# Patient Record
Sex: Male | Born: 1935 | Race: White | Hispanic: No | Marital: Married | Smoking: Former smoker
Health system: Southern US, Community
[De-identification: ages and names within clinical notes are randomized; demographics above are authoritative.]

## PROBLEM LIST (undated history)

## (undated) DIAGNOSIS — I10 Essential (primary) hypertension: Secondary | ICD-10-CM

## (undated) DIAGNOSIS — H9190 Unspecified hearing loss, unspecified ear: Secondary | ICD-10-CM

## (undated) DIAGNOSIS — C189 Malignant neoplasm of colon, unspecified: Secondary | ICD-10-CM

## (undated) DIAGNOSIS — T4145XA Adverse effect of unspecified anesthetic, initial encounter: Secondary | ICD-10-CM

## (undated) DIAGNOSIS — C7931 Secondary malignant neoplasm of brain: Secondary | ICD-10-CM

## (undated) DIAGNOSIS — G709 Myoneural disorder, unspecified: Secondary | ICD-10-CM

## (undated) DIAGNOSIS — F32A Depression, unspecified: Secondary | ICD-10-CM

## (undated) DIAGNOSIS — R972 Elevated prostate specific antigen [PSA]: Secondary | ICD-10-CM

## (undated) DIAGNOSIS — R159 Full incontinence of feces: Secondary | ICD-10-CM

## (undated) DIAGNOSIS — Z9889 Other specified postprocedural states: Secondary | ICD-10-CM

## (undated) DIAGNOSIS — Z9289 Personal history of other medical treatment: Secondary | ICD-10-CM

## (undated) DIAGNOSIS — F03A0 Unspecified dementia, mild, without behavioral disturbance, psychotic disturbance, mood disturbance, and anxiety: Secondary | ICD-10-CM

## (undated) DIAGNOSIS — R203 Hyperesthesia: Secondary | ICD-10-CM

## (undated) DIAGNOSIS — G930 Cerebral cysts: Secondary | ICD-10-CM

## (undated) DIAGNOSIS — F0390 Unspecified dementia without behavioral disturbance: Secondary | ICD-10-CM

## (undated) DIAGNOSIS — E7800 Pure hypercholesterolemia, unspecified: Secondary | ICD-10-CM

## (undated) DIAGNOSIS — N200 Calculus of kidney: Secondary | ICD-10-CM

## (undated) DIAGNOSIS — Z85828 Personal history of other malignant neoplasm of skin: Secondary | ICD-10-CM

## (undated) DIAGNOSIS — F329 Major depressive disorder, single episode, unspecified: Secondary | ICD-10-CM

## (undated) DIAGNOSIS — Z923 Personal history of irradiation: Secondary | ICD-10-CM

## (undated) DIAGNOSIS — M1990 Unspecified osteoarthritis, unspecified site: Secondary | ICD-10-CM

## (undated) DIAGNOSIS — C439 Malignant melanoma of skin, unspecified: Secondary | ICD-10-CM

## (undated) DIAGNOSIS — T8859XA Other complications of anesthesia, initial encounter: Secondary | ICD-10-CM

## (undated) HISTORY — PX: HEMORRHOID SURGERY: SHX153

## (undated) HISTORY — PX: CHOLECYSTECTOMY: SHX55

## (undated) HISTORY — DX: Pure hypercholesterolemia, unspecified: E78.00

## (undated) HISTORY — PX: APPENDECTOMY: SHX54

## (undated) HISTORY — DX: Malignant neoplasm of colon, unspecified: C18.9

## (undated) HISTORY — DX: Malignant melanoma of skin, unspecified: C43.9

## (undated) HISTORY — PX: COLON SURGERY: SHX602

## (undated) HISTORY — DX: Personal history of other malignant neoplasm of skin: Z85.828

## (undated) HISTORY — DX: Other specified postprocedural states: Z98.890

## (undated) HISTORY — DX: Cerebral cysts: G93.0

## (undated) HISTORY — PX: OTHER SURGICAL HISTORY: SHX169

## (undated) HISTORY — DX: Essential (primary) hypertension: I10

## (undated) HISTORY — DX: Secondary malignant neoplasm of brain: C79.31

## (undated) HISTORY — PX: SKIN GRAFT: SHX250

## (undated) HISTORY — PX: VASECTOMY: SHX75

## (undated) HISTORY — DX: Personal history of irradiation: Z92.3

## (undated) HISTORY — PX: MELANOMA EXCISION: SHX5266

---

## 1999-08-06 ENCOUNTER — Ambulatory Visit (HOSPITAL_BASED_OUTPATIENT_CLINIC_OR_DEPARTMENT_OTHER): Admission: RE | Admit: 1999-08-06 | Discharge: 1999-08-06 | Payer: Self-pay | Admitting: *Deleted

## 2002-01-30 ENCOUNTER — Ambulatory Visit (HOSPITAL_COMMUNITY): Admission: RE | Admit: 2002-01-30 | Discharge: 2002-01-30 | Payer: Self-pay | Admitting: Internal Medicine

## 2003-04-21 HISTORY — PX: OTHER SURGICAL HISTORY: SHX169

## 2003-04-21 HISTORY — PX: EXTERNAL EAR SURGERY: SHX627

## 2004-07-31 ENCOUNTER — Other Ambulatory Visit: Admission: RE | Admit: 2004-07-31 | Discharge: 2004-07-31 | Payer: Self-pay | Admitting: Unknown Physician Specialty

## 2004-10-02 ENCOUNTER — Other Ambulatory Visit: Admission: RE | Admit: 2004-10-02 | Discharge: 2004-10-02 | Payer: Self-pay | Admitting: Unknown Physician Specialty

## 2004-10-30 ENCOUNTER — Other Ambulatory Visit: Admission: RE | Admit: 2004-10-30 | Discharge: 2004-10-30 | Payer: Self-pay | Admitting: Dermatology

## 2005-01-19 ENCOUNTER — Ambulatory Visit (HOSPITAL_COMMUNITY): Admission: RE | Admit: 2005-01-19 | Discharge: 2005-01-19 | Payer: Self-pay | Admitting: Orthopaedic Surgery

## 2006-04-20 DIAGNOSIS — C189 Malignant neoplasm of colon, unspecified: Secondary | ICD-10-CM

## 2006-04-20 HISTORY — DX: Malignant neoplasm of colon, unspecified: C18.9

## 2006-04-21 ENCOUNTER — Ambulatory Visit (HOSPITAL_COMMUNITY): Admission: RE | Admit: 2006-04-21 | Discharge: 2006-04-21 | Payer: Self-pay

## 2006-07-08 ENCOUNTER — Ambulatory Visit (HOSPITAL_COMMUNITY): Admission: RE | Admit: 2006-07-08 | Discharge: 2006-07-08 | Payer: Self-pay | Admitting: Pulmonary Disease

## 2006-07-16 ENCOUNTER — Ambulatory Visit (HOSPITAL_COMMUNITY): Admission: RE | Admit: 2006-07-16 | Discharge: 2006-07-16 | Payer: Self-pay | Admitting: Family Medicine

## 2006-08-18 ENCOUNTER — Ambulatory Visit: Payer: Self-pay | Admitting: Internal Medicine

## 2006-09-03 ENCOUNTER — Ambulatory Visit (HOSPITAL_COMMUNITY): Admission: RE | Admit: 2006-09-03 | Discharge: 2006-09-03 | Payer: Self-pay | Admitting: Internal Medicine

## 2006-09-03 ENCOUNTER — Ambulatory Visit: Payer: Self-pay | Admitting: Internal Medicine

## 2006-09-06 ENCOUNTER — Ambulatory Visit (HOSPITAL_COMMUNITY): Admission: RE | Admit: 2006-09-06 | Discharge: 2006-09-06 | Payer: Self-pay | Admitting: Internal Medicine

## 2006-09-29 ENCOUNTER — Encounter: Payer: Self-pay | Admitting: Internal Medicine

## 2006-09-29 ENCOUNTER — Ambulatory Visit (HOSPITAL_COMMUNITY): Admission: RE | Admit: 2006-09-29 | Discharge: 2006-09-29 | Payer: Self-pay | Admitting: Internal Medicine

## 2006-09-29 ENCOUNTER — Ambulatory Visit: Payer: Self-pay | Admitting: Internal Medicine

## 2006-11-04 ENCOUNTER — Inpatient Hospital Stay (HOSPITAL_COMMUNITY): Admission: RE | Admit: 2006-11-04 | Discharge: 2006-11-09 | Payer: Self-pay | Admitting: Surgery

## 2006-11-04 ENCOUNTER — Encounter (INDEPENDENT_AMBULATORY_CARE_PROVIDER_SITE_OTHER): Payer: Self-pay | Admitting: Surgery

## 2006-11-17 ENCOUNTER — Ambulatory Visit (HOSPITAL_COMMUNITY): Admission: RE | Admit: 2006-11-17 | Discharge: 2006-11-17 | Payer: Self-pay | Admitting: Neurosurgery

## 2006-12-10 ENCOUNTER — Ambulatory Visit (HOSPITAL_COMMUNITY): Payer: Self-pay | Admitting: Oncology

## 2006-12-10 ENCOUNTER — Encounter (HOSPITAL_COMMUNITY): Admission: RE | Admit: 2006-12-10 | Discharge: 2007-01-09 | Payer: Self-pay | Admitting: Oncology

## 2007-03-14 ENCOUNTER — Encounter (HOSPITAL_COMMUNITY): Admission: RE | Admit: 2007-03-14 | Discharge: 2007-04-13 | Payer: Self-pay | Admitting: Oncology

## 2007-03-14 ENCOUNTER — Ambulatory Visit (HOSPITAL_COMMUNITY): Payer: Self-pay | Admitting: Oncology

## 2007-06-06 ENCOUNTER — Encounter (HOSPITAL_COMMUNITY): Admission: RE | Admit: 2007-06-06 | Discharge: 2007-07-06 | Payer: Self-pay | Admitting: Oncology

## 2007-06-06 ENCOUNTER — Ambulatory Visit (HOSPITAL_COMMUNITY): Payer: Self-pay | Admitting: Oncology

## 2007-09-05 ENCOUNTER — Ambulatory Visit (HOSPITAL_COMMUNITY): Payer: Self-pay | Admitting: Oncology

## 2007-09-05 ENCOUNTER — Encounter (HOSPITAL_COMMUNITY): Admission: RE | Admit: 2007-09-05 | Discharge: 2007-10-05 | Payer: Self-pay | Admitting: Oncology

## 2007-10-10 ENCOUNTER — Ambulatory Visit (HOSPITAL_COMMUNITY): Admission: RE | Admit: 2007-10-10 | Discharge: 2007-10-10 | Payer: Self-pay | Admitting: Family Medicine

## 2007-10-11 ENCOUNTER — Encounter: Payer: Self-pay | Admitting: Internal Medicine

## 2007-10-11 ENCOUNTER — Ambulatory Visit: Payer: Self-pay | Admitting: Internal Medicine

## 2007-10-11 ENCOUNTER — Ambulatory Visit (HOSPITAL_COMMUNITY): Admission: RE | Admit: 2007-10-11 | Discharge: 2007-10-11 | Payer: Self-pay | Admitting: Internal Medicine

## 2007-12-05 ENCOUNTER — Encounter (HOSPITAL_COMMUNITY): Admission: RE | Admit: 2007-12-05 | Discharge: 2008-01-04 | Payer: Self-pay | Admitting: Oncology

## 2007-12-05 ENCOUNTER — Ambulatory Visit (HOSPITAL_COMMUNITY): Payer: Self-pay | Admitting: Oncology

## 2008-03-05 ENCOUNTER — Ambulatory Visit (HOSPITAL_COMMUNITY): Payer: Self-pay | Admitting: Oncology

## 2008-03-05 ENCOUNTER — Encounter (HOSPITAL_COMMUNITY): Admission: RE | Admit: 2008-03-05 | Discharge: 2008-04-04 | Payer: Self-pay | Admitting: Oncology

## 2008-04-17 ENCOUNTER — Encounter (HOSPITAL_COMMUNITY): Admission: RE | Admit: 2008-04-17 | Discharge: 2008-05-17 | Payer: Self-pay | Admitting: Oncology

## 2008-07-18 ENCOUNTER — Encounter (HOSPITAL_COMMUNITY): Admission: RE | Admit: 2008-07-18 | Discharge: 2008-08-17 | Payer: Self-pay | Admitting: Oncology

## 2008-07-18 ENCOUNTER — Ambulatory Visit (HOSPITAL_COMMUNITY): Payer: Self-pay | Admitting: Oncology

## 2008-12-05 ENCOUNTER — Ambulatory Visit (HOSPITAL_COMMUNITY): Payer: Self-pay | Admitting: Oncology

## 2008-12-05 ENCOUNTER — Encounter (HOSPITAL_COMMUNITY): Admission: RE | Admit: 2008-12-05 | Discharge: 2009-01-04 | Payer: Self-pay | Admitting: Oncology

## 2009-01-02 ENCOUNTER — Encounter: Payer: Self-pay | Admitting: Internal Medicine

## 2009-03-06 ENCOUNTER — Encounter (HOSPITAL_COMMUNITY): Admission: RE | Admit: 2009-03-06 | Discharge: 2009-04-05 | Payer: Self-pay | Admitting: Oncology

## 2009-03-06 ENCOUNTER — Ambulatory Visit (HOSPITAL_COMMUNITY): Payer: Self-pay | Admitting: Oncology

## 2009-05-03 ENCOUNTER — Ambulatory Visit (HOSPITAL_COMMUNITY): Admission: RE | Admit: 2009-05-03 | Discharge: 2009-05-03 | Payer: Self-pay | Admitting: Neurosurgery

## 2009-05-29 ENCOUNTER — Encounter (HOSPITAL_COMMUNITY): Admission: RE | Admit: 2009-05-29 | Discharge: 2009-06-28 | Payer: Self-pay | Admitting: Oncology

## 2009-05-29 ENCOUNTER — Ambulatory Visit (HOSPITAL_COMMUNITY): Payer: Self-pay | Admitting: Oncology

## 2009-08-21 ENCOUNTER — Ambulatory Visit (HOSPITAL_COMMUNITY): Payer: Self-pay | Admitting: Oncology

## 2009-08-21 ENCOUNTER — Encounter (HOSPITAL_COMMUNITY): Admission: RE | Admit: 2009-08-21 | Discharge: 2009-09-20 | Payer: Self-pay | Admitting: Oncology

## 2009-12-04 ENCOUNTER — Ambulatory Visit (HOSPITAL_COMMUNITY): Payer: Self-pay | Admitting: Oncology

## 2009-12-04 ENCOUNTER — Encounter (HOSPITAL_COMMUNITY): Admission: RE | Admit: 2009-12-04 | Discharge: 2010-01-03 | Payer: Self-pay | Admitting: Oncology

## 2009-12-20 ENCOUNTER — Encounter: Payer: Self-pay | Admitting: Internal Medicine

## 2010-02-26 ENCOUNTER — Ambulatory Visit (HOSPITAL_COMMUNITY): Payer: Self-pay | Admitting: Oncology

## 2010-02-26 ENCOUNTER — Encounter (HOSPITAL_COMMUNITY)
Admission: RE | Admit: 2010-02-26 | Discharge: 2010-03-28 | Payer: Self-pay | Source: Home / Self Care | Attending: Oncology | Admitting: Oncology

## 2010-05-14 ENCOUNTER — Encounter (HOSPITAL_COMMUNITY)
Admission: RE | Admit: 2010-05-14 | Discharge: 2010-05-20 | Payer: Self-pay | Source: Home / Self Care | Attending: Oncology | Admitting: Oncology

## 2010-05-14 ENCOUNTER — Ambulatory Visit (HOSPITAL_COMMUNITY)
Admission: RE | Admit: 2010-05-14 | Discharge: 2010-05-20 | Payer: Self-pay | Source: Home / Self Care | Attending: Oncology | Admitting: Oncology

## 2010-05-14 LAB — CEA: CEA: 1.4 ng/mL (ref 0.0–5.0)

## 2010-05-22 NOTE — Letter (Signed)
Summary: [facility]  [facility]   Imported By: Rexene Alberts 12/20/2009 11:47:41  _____________________________________________________________________  External Attachment:    Type:   Image     Comment:   External Document

## 2010-07-01 LAB — CEA: CEA: 1.2 ng/mL (ref 0.0–5.0)

## 2010-07-04 LAB — COMPREHENSIVE METABOLIC PANEL
ALT: 19 U/L (ref 0–53)
AST: 20 U/L (ref 0–37)
Albumin: 3.7 g/dL (ref 3.5–5.2)
Alkaline Phosphatase: 64 U/L (ref 39–117)
BUN: 7 mg/dL (ref 6–23)
CO2: 28 mEq/L (ref 19–32)
Calcium: 9.3 mg/dL (ref 8.4–10.5)
Chloride: 109 mEq/L (ref 96–112)
Creatinine, Ser: 0.8 mg/dL (ref 0.4–1.5)
GFR calc Af Amer: >60 mL/min (ref >60)
GFR calc non Af Amer: >60 mL/min (ref >60)
Glucose, Bld: 83 mg/dL (ref 70–99)
Potassium: 4.2 mEq/L (ref 3.5–5.1)
Sodium: 141 mEq/L (ref 135–145)
Total Bilirubin: 0.7 mg/dL (ref 0.3–1.2)
Total Protein: 6.4 g/dL (ref 6.0–8.3)

## 2010-07-04 LAB — DIFFERENTIAL
Basophils Absolute: 0 10*3/uL (ref 0.0–0.1)
Basophils Relative: 1 % (ref 0–1)
Eosinophils Absolute: 0 10*3/uL (ref 0.0–0.7)
Eosinophils Relative: 1 % (ref 0–5)
Lymphocytes Relative: 30 % (ref 12–46)
Lymphs Abs: 1.9 10*3/uL (ref 0.7–4.0)
Monocytes Absolute: 0.7 10*3/uL (ref 0.1–1.0)
Monocytes Relative: 11 % (ref 3–12)
Neutro Abs: 3.8 10*3/uL (ref 1.7–7.7)
Neutrophils Relative %: 58 % (ref 43–77)

## 2010-07-04 LAB — CBC
HCT: 45.1 % (ref 39.0–52.0)
Hemoglobin: 15.1 g/dL (ref 13.0–17.0)
MCH: 30.7 pg (ref 26.0–34.0)
MCHC: 33.4 g/dL (ref 30.0–36.0)
MCV: 91.9 fL (ref 78.0–100.0)
Platelets: 169 10*3/uL (ref 150–400)
RBC: 4.91 MIL/uL (ref 4.22–5.81)
RDW: 13.8 % (ref 11.5–15.5)
WBC: 6.5 10*3/uL (ref 4.0–10.5)

## 2010-07-04 LAB — CEA: CEA: 1.8 ng/mL (ref 0.0–5.0)

## 2010-07-06 LAB — CREATININE, SERUM
Creatinine, Ser: 0.86 mg/dL (ref 0.4–1.5)
GFR calc Af Amer: >60 mL/min (ref >60)
GFR calc non Af Amer: >60 mL/min (ref >60)

## 2010-07-08 LAB — CEA: CEA: 1.3 ng/mL (ref 0.0–5.0)

## 2010-07-09 LAB — CEA: CEA: 1.8 ng/mL (ref 0.0–5.0)

## 2010-07-23 LAB — CEA: CEA: 0.9 ng/mL (ref 0.0–5.0)

## 2010-07-26 LAB — DIFFERENTIAL
Eosinophils Relative: 1 % (ref 0–5)
Lymphocytes Relative: 30 % (ref 12–46)
Lymphs Abs: 1.6 10*3/uL (ref 0.7–4.0)
Monocytes Absolute: 0.5 10*3/uL (ref 0.1–1.0)
Monocytes Relative: 10 % (ref 3–12)
Neutro Abs: 3 10*3/uL (ref 1.7–7.7)

## 2010-07-26 LAB — COMPREHENSIVE METABOLIC PANEL
ALT: 19 U/L (ref 0–53)
AST: 22 U/L (ref 0–37)
Albumin: 3.7 g/dL (ref 3.5–5.2)
Alkaline Phosphatase: 72 U/L (ref 39–117)
BUN: 12 mg/dL (ref 6–23)
CO2: 28 mEq/L (ref 19–32)
Calcium: 9 mg/dL (ref 8.4–10.5)
Chloride: 106 mEq/L (ref 96–112)
Creatinine, Ser: 0.84 mg/dL (ref 0.4–1.5)
GFR calc Af Amer: >60 mL/min (ref >60)
GFR calc non Af Amer: >60 mL/min (ref >60)
Glucose, Bld: 95 mg/dL (ref 70–99)
Potassium: 3.6 mEq/L (ref 3.5–5.1)
Sodium: 140 mEq/L (ref 135–145)
Total Bilirubin: 0.5 mg/dL (ref 0.3–1.2)
Total Protein: 6.5 g/dL (ref 6.0–8.3)

## 2010-07-26 LAB — CBC
HCT: 43.2 % (ref 39.0–52.0)
Hemoglobin: 14.9 g/dL (ref 13.0–17.0)
MCHC: 34.6 g/dL (ref 30.0–36.0)
MCV: 91.3 fL (ref 78.0–100.0)
Platelets: 166 10*3/uL (ref 150–400)
RBC: 4.73 MIL/uL (ref 4.22–5.81)
RDW: 13.8 % (ref 11.5–15.5)
WBC: 5.2 10*3/uL (ref 4.0–10.5)

## 2010-07-26 LAB — CEA: CEA: 1.4 ng/mL (ref 0.0–5.0)

## 2010-08-06 ENCOUNTER — Encounter (HOSPITAL_COMMUNITY): Payer: Medicare Other | Attending: Oncology

## 2010-08-06 DIAGNOSIS — Z85038 Personal history of other malignant neoplasm of large intestine: Secondary | ICD-10-CM | POA: Insufficient documentation

## 2010-08-06 DIAGNOSIS — C189 Malignant neoplasm of colon, unspecified: Secondary | ICD-10-CM

## 2010-09-02 NOTE — Op Note (Signed)
NAMEMarland Kitchen  Andre Pollard, Andre Pollard              ACCOUNT NO.:  [id number]   MEDICAL RECORD NO.:  [id number]          PATIENT TYPE:  INP   LOCATION:  1301                         FACILITY:  [facility]   PHYSICIAN:  Thornton Park. Daphine Deutscher, MD  DATE OF BIRTH:  May 20, 1929   DATE OF PROCEDURE:  11/04/2006  DATE OF DISCHARGE:                               OPERATIVE REPORT   CCS number [id number].   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   PROCEDURE:  Transverse colectomy with primary side-to-side hand-sewn  anastomosis.   SURGEON:  Thornton Park. Daphine Deutscher, MD   ASSISTANT:  Alfonse Ras, MD   ANESTHESIA:  General endotracheal.   ESTIMATED BLOOD LOSS:  70 mL.   DESCRIPTION OF PROCEDURE:  Andre Pollard was taken to room 11 on  Thursday afternoon, November 04, 2006 given general anesthesia.  The abdomen  was prepped with Techni-Care and draped sterilely.  A midline incision  was made around the umbilicus initially to try to localize bowel and  then it was apparent this was up pretty high so it was extended  cephalad.  Abdomen was entered and liver was palpated and did not reveal  any obvious metastatic disease.  The mass itself was hard area pulling  in the omentum around it.  I then went up and got around it mobilizing  it.  I mobilized the right colon, brought it toward the midline and  mobilized the portion of the left colon over to the splenic flexure.  I  then found proximal and distal margins and divided with the GIA.  I went  through the mesentery with the LigaSure device, oversewing the major  vessels with figure-of-eight sutures of 2-0 silk.   The transverse colon was redundant such that I could without tension  pull it where they were well overlapping.  I went ahead and performed  such a maneuver with the distal end more cephalad than the proximal and.  I then attached these two with a posterior row of  interrupted 3-0 silks.  I then opened along the anterior bowel and got a nice visualization of  the mucosa on either side.  I then ran an inner layer of 4-0 PDS in a  running locking fashion, carried anteriorly in a Connell fashion with  outer layer of interrupted 3-0 silk Lembert sutures to complete the  anterior layer of the anastomosis.  A nice palpable anastomosis could be  felt and appeared to be free of tension.  I ran the small bowel and it  was all intact.  I changed my gloves and irrigated with a couple liters  of saline and looked again at the right gutter and controlled any kind  of little oozing with the cautery.  The sponge and needle counts  reported as correct.  The fascia was then closed interrupted #1 Novofil  and with a fish in place subsequently  removing that.  After irrigating the skin was closed with staples.  The  patient tolerated procedure well and was taken to recovery room in  satisfactory  condition.  Pathology identified the specimen where I put a  suture on the proximal margin and they found this cancer had good  margins.      Thornton Park Daphine Deutscher, MD  Electronically Signed     MBM/MEDQ  D:  11/04/2006  T:  11/05/2006  Job:  [id number]   cc:   [name] L. [name], M.D.  Fax: [phone]   R. Roetta Sessions, M.D.  [address]  [place]  [place] [id number]

## 2010-09-02 NOTE — Discharge Summary (Signed)
NAMEMarland Kitchen  NESBIT, MICHON              ACCOUNT NO.:  [id number]   MEDICAL RECORD NO.:  [id number]          PATIENT TYPE:  INP   LOCATION:  1301                         FACILITY:  [facility]   PHYSICIAN:  Thornton Park. Daphine Deutscher, MD  DATE OF BIRTH:  [birth year]   DATE OF ADMISSION:  11/04/2006  DATE OF DISCHARGE:  11/09/2006                               DISCHARGE SUMMARY   ADMITTING DIAGNOSIS:  Transverse colon cancer.   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   COURSE IN THE HOSPITAL:  Andre Pollard is a 77 year old gentleman who  was admitted on 07/17 for partial colectomy.  Procedure was performed  and he had a  transverse anastomosis, hand sewn.  He had acute and  chronic blood loss anemia when he was admitted to the hospital as  evidenced by hemoglobin in the 8's  and postoperatively we did give him  2 units transfusion.  He was ready for discharge on postop day #5,  wanted to go home.  Incision was okay and he was drinking plenty of  liquids and having bowel movements.  He was given a prescription to take  of Vicodin for pain.  He will be followed up in the office in  approximately 3 weeks.  Arrangements were made as an outpatient for  medical oncology appointment.      Thornton Park Daphine Deutscher, MD  Electronically Signed     MBM/MEDQ  D:  11/09/2006  T:  11/09/2006  Job:  [id number]   cc:   [name] L. [name], M.D.  Fax: [phone]   R. Roetta Sessions, M.D.  [address]  [place]  [place] [address]   Medical oncologist at [facility].

## 2010-09-02 NOTE — Op Note (Signed)
NAMEMarland Kitchen  Andre Pollard, Andre Pollard              ACCOUNT NO.:  [id number]   MEDICAL RECORD NO.:  [id number]          PATIENT TYPE:  AMB   LOCATION:  DAY                           FACILITY:  [facility]   PHYSICIAN:  R. Roetta Sessions, M.D. DATE OF BIRTH:  October 01, 1929   DATE OF PROCEDURE:  09/29/2006  DATE OF DISCHARGE:                               OPERATIVE REPORT   PROCEDURE PERFORMED:  Incomplete colonoscopy with biopsy.   INDICATIONS FOR PROCEDURE:  The patient is a 77 year old gentleman with  nonspecific abdominal pain. Recent EGD demonstrated only a small hiatal  hernia.  He underwent a CT angiogram to rule out ischemia and no obvious  ischemic high grade vascular lesions.  However, there was an abnormality  in the transverse colon, some thickening of the wall and in the lumen.  This was focal (I reviewed the CT scan with the radiologist today and  that appears to be the case). Consequently, he comes for colonoscopy.  He has never had his lower GI tract imaged previously.  The potential  risks, benefits and alternatives have been reviewed, questions answered.  He is agreeable.  Please see documentation in the medical record.   PROCEDURE NOTE:  O2 saturation, blood pressure, and pulse oximetry were  monitored throughout the entire procedure. Conscious sedation with  Versed at 3 mg IV and Demerol 100 mg IV in divided doses. Instrument  Pentax video chip adult and pediatric scopes.   FINDINGS:  Digital rectal exam revealed no abnormalities.  The prep was  adequate.  The mucosa was surveyed from the rectosigmoid junction  through the left and into the transverse segments. The patient has  numerous diverticula. In the mid transverse colon, there was stricturing  of the colon with what appeared to be ulceration and some mucosal  heaping of the mucosa in this area. Some of the mucosa did have an  adenomatous appearance, but there was no out and out exophytic carcinoma  apparent.  I was unable to advance  across this lesion with the adult  scope, I withdrew and obtained a pediatric colonoscope. Again, it would  not even admit the pediatric scope.  It was a fairly tight aperture.  Please see multiple photos.  Multiple biopsies of this area were taken  for histologic study.  The scope was slowly withdrawn, all previously  mentioned mucosal surfaces were again seen. The scope was pulled down in  the rectum.  Examination of the rectal mucosa including retroflexion of  the anal verge demonstrated no abnormalities.  The patient tolerated the  procedure well and was reacted in endoscopy.   IMPRESSION:  Normal rectum, left sided transverse diverticula,  obstructing process in the mid transverse colon with ulceration. This  may well be a carcinoma but was lacking typical features, would wonder  about the possibility of a diverticulitis or ischemia, etc.  This is  likely the cause of Andre Pollard's abdominal pain.   RECOMMENDATIONS:  1. Will follow up on path.  2. Regardless of path findings, he will need surgical resection of      this area. I have discussed this  with Andre Pollard and surgical      consultation will be pursued.      [name], M.D.  Electronically Signed     RMR/MEDQ  D:  09/29/2006  T:  09/29/2006  Job:  [id number]   cc:   [name] L. [name], M.D.  Fax: [phone]

## 2010-09-02 NOTE — Op Note (Signed)
NAMEMarland Kitchen  DAT, DERKSEN              ACCOUNT NO.:  [id number]   MEDICAL RECORD NO.:  [id number]          PATIENT TYPE:  AMB   LOCATION:  DAY                           FACILITY:  [facility]   PHYSICIAN:  R. Roetta Sessions, M.D. DATE OF BIRTH:  May 31, 1929   DATE OF PROCEDURE:  10/11/2007  DATE OF DISCHARGE:                               OPERATIVE REPORT   INDICATIONS FOR PROCEDURE:  A 77 year old gentleman who was found to  have an obstructing colon cancer.  His cancer was found by me at a  colonoscopy September 29, 2007.  He underwent resection down in [place].  He had limited stage disease and did not require adjuvant therapy.  He  has done well.  He is here for 1-year surveillance.  Risks, benefits,  alternatives, and limitations have been reviewed.  Questions were  answered.  He is agreeable.  Please see the documentation in the medical  record.   PROCEDURE NOTE:  O2 saturation, blood pressure, and pulse of the patient  monitored throughout the entire procedure.   CONSCIOUS SEDATION:  Versed 3 mg IV and Demerol 75 g IV in divided  doses.   INSTRUMENT:  Pentax video chip system.   FINDINGS:  Digital rectal exam revealed no abnormalities.  Endoscopic  findings:  The prep was adequate.  Colon:  Colonic mucosa was surveyed  from the rectosigmoid junction through the more proximal colon all the  way to the ileocecal valve.  There was a surgical anastomosis about  midway from rectosigmoid junction, the ileocecal valve, cecum, and  appendiceal orifice.  The anastomosis appeared normal although mucosa of  the anastomosis appeared from the ileocecal valve/cecum.  The scope was  slowly withdrawn.  All previously mentioned mucosal surfaces were again  seen.  The anastomosis from the distance from the ileocecal valve  appeared to be located where the hepatic flexure used to reside.  There  was a 5-mm polyp, a 5-cm distal ileocecal valve, it was cold snared and  being recovered at the time of this  dictation.  The remainder of the  residual colonic mucosa appeared normal except for left-sided  diverticula.  The scope was pulled down to the rectum with examination  of rectal mucosa, including retroflexed view of the anal verge,  demonstrated no abnormalities.  The patient tolerated the procedure well  and was reacted in Endoscopy.   IMPRESSION:  Normal rectum, left-sided diverticula, surgical anatomosis  as described above, polyp just distal to ileocecal valve removed by  snare as described above.  Otherwise, unremarkable residual colonic  mucosa.   RECOMMENDATION:  1. Follow up on path.  2. Further recommendations to follow.      [name], M.D.  Electronically Signed     RMR/MEDQ  D:  10/11/2007  T:  10/12/2007  Job:  [id number]

## 2010-09-02 NOTE — H&P (Signed)
NAMEMarland Pollard  Andre, Pollard              ACCOUNT NO.:  [id number]   MEDICAL RECORD NO.:  [id number]          PATIENT TYPE:  INP   LOCATION:  1301                         FACILITY:  [facility]   PHYSICIAN:  Thornton Park. Daphine Deutscher, MD  DATE OF BIRTH:  01-02-1930   DATE OF ADMISSION:  11/04/2006  DATE OF DISCHARGE:  11/09/2006                              HISTORY & PHYSICAL   CHIEF COMPLAINTS:  Carcinoma in the transverse colon.   HISTORY:  This is a 77 year old white male from [place] who presented  with decrease in caliber of stools and was found to have a near  obstructing transverse colon cancer and anemia.  He had been scoped by  Dr. Roetta Sessions of [facility] and is followed by Dr.  Kari Baars.   He is admitted at this time for partial colectomy.   PAST MEDICAL HISTORY:  The patient takes baby aspirin daily, fish oral,  metoprolol, Flomax, citrulline, and Crestor.   ALLERGIES:  MORPHINE.   PAST SURGERIES:  1. Cholecystectomy.  2. Carpal tunnel hand surgery.   SOCIAL HISTORY:  The patient does not smoke or drink.   FAMILY HISTORY:  Both parents were deceased with heart disease.   PHYSICAL EXAMINATION:  VITAL SIGNS:  Height 5 feet 10 inches tall,  weight 173 pounds. Blood pressure 130/82, pulse 80, afebrile.  HEENT:  Head:  Normocephalic.  Eyes:  Sclerae nonicteric.  Pupils equal  and reactive to light.  Nose and throat exam unremarkable.  NECK:  Supple.  No bruits.  CHEST:  Clear.  HEART:  Sinus rhythm without murmurs or gallops.  ABDOMEN:  Not obese and nontender.  EXTREMITIES:  Full range of motion.   IMPRESSION:  Transverse colon adenocarcinoma.   PLAN:  Completion of bowel prep followed by partial colectomy.      Thornton Park Daphine Deutscher, MD  Electronically Signed     MBM/MEDQ  D:  12/07/2006  T:  12/07/2006  Job:  [id number]

## 2010-09-02 NOTE — Op Note (Signed)
NAMEMarland Pollard  SAAHAS, HIDROGO              ACCOUNT NO.:  [id number]   MEDICAL RECORD NO.:  [id number]          PATIENT TYPE:  AMB   LOCATION:  DAY                           FACILITY:  [facility]   PHYSICIAN:  R. Roetta Sessions, M.D. DATE OF BIRTH:  Nov 15, 1929   DATE OF PROCEDURE:  09/03/2006  DATE OF DISCHARGE:                               OPERATIVE REPORT   PROCEDURE:  EGD.   INDICATIONS FOR PROCEDURE:  Patient is a 77 year old gentleman with  waxing and waning paraumbilical epigastric pain.  Intussusception seen  on CT previously.  His abdominal pain has some atypical features. The  EGD is now being done and this approach has been discussed with the  patient at length.  Potential risks, benefits and alternatives have been  reviewed, questions answered.  Please see documentation in the medical  record.   His O2 saturation, blood pressure, pulse and respirations were monitored  throughout the entire procedure.   CONSCIOUS SEDATION:  Versed 3 mg IV, Demerol 25 mg IV in divided doses.  Cetacaine spray for topical pharyngeal anesthesia.   INSTRUMENT:  Olympus Pentax video chip system.   FINDINGS:  Examination through the esophagus revealed no mucosal  abnormalities.  The EG junction was easily traversed, stomach:  Gastric  cavity was empty and insufflated well with air.  Thorough examination of  the gastric mucosa, to include retroflexion, proximal stomach,  esophagogastric junction, demonstrated no abnormalities aside from a  tiny hiatal hernia.  Pylorus was patent, easily traversed.  Examination  of the bulb, second and third portion were well seen and appeared  normal.  The ampulla was seen, as well, and it appeared normal.  Therapeutic/diagnostic maneuvers performed:  None.   The patient tolerated the procedure well and was reactive.   ENDOSCOPY IMPRESSION:  Normal esophagus.  Tiny hiatal hernia.  Otherwise  normal stomach, D1 through D3.  No endoscopic explanation for the  patient's  abdominal pain on today's exam.   RECOMMENDATIONS:  1. Proceed with CT angiogram to further ischemic etiology for his      symptoms.  Would also like to re-image his      small bowel at that time and rule out an intussusception.      Intussusception previously may be an incidental finding or may have      something to do with his symptoms.  2. Further recommendations to follow.      [name], M.D.  Electronically Signed     RMR/MEDQ  D:  09/03/2006  T:  09/03/2006  Job:  [id number]   cc:   [name] L. [name], M.D.  Fax: [phone]

## 2010-09-05 NOTE — Op Note (Signed)
[facility]  Patient:    Andre Pollard, Andre Pollard                       MRN: [id number] Proc. Date: 08/06/99 Adm. Date:  [id number] Attending:  Kendell Bane                           Operative Report  PREOPERATIVE DIAGNOSIS:  Right carpal tunnel syndrome.  POSTOPERATIVE DIAGNOSIS:  Right carpal tunnel syndrome.  OPERATION PERFORMED:  Decompression median nerve, right carpal tunnel.  SURGEON:  Lowell Bouton, M.D.  ANESTHESIA:  0.5% Marcaine local with sedation.  OPERATIVE FINDINGS:  The patient had a very tight carpal canal with no masses present.  The motor branch of the nerve was intact.  There was a significant amount of thickening of the tenosynovium around the flexor tendons.  DESCRIPTION OF PROCEDURE:  Under 0.5% Marcaine local anesthesia with a tourniquet on the right arm, the right hand was prepped and draped in the usual fashion and after exsanguinating the limb, the tourniquet was inflated to 275 mmHg.  A 3 cm longitudinal incision was made just ulnar to the thenar crease in the palm.  Sharp dissection was carried through the subcutaneous tissues and bleeding points were coagulated.  Blunt dissection was carried through the superficial palmar fascia distal to the transverse carpal ligament.  The median nerve was identified distally and a hemostat was placed in the carpal canal up against the hook of the hamate.  The transverse carpal ligament was then divided on the ulnar border of the median nerve.  This was done using a knife and the proximal end of the ligament was divided with the scissors after dissecting the nerve away from the undersurface of the ligament.  The carpal canal was then palpated and was found to be adequately decompressed.  The nerve was examined and explored and the motor branch was identified and found to be intact.  The carpal canal was explored and no masses were found.  The wound was then  irrigated with saline and the skin was closed with 4-0 nylon sutures.  Sterile dressings were applied followed by a volar wrist splint.  The patient tolerated the procedure well and went to the recovery room awake and stable in good condition. DD:  08/06/99 TD:  08/07/99 Job: [id number]
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
## 2010-09-26 ENCOUNTER — Encounter: Payer: Self-pay | Admitting: Internal Medicine

## 2010-10-02 ENCOUNTER — Encounter: Payer: Self-pay | Admitting: Gastroenterology

## 2010-10-02 ENCOUNTER — Ambulatory Visit (INDEPENDENT_AMBULATORY_CARE_PROVIDER_SITE_OTHER): Payer: Medicare Other | Admitting: Gastroenterology

## 2010-10-02 VITALS — BP 145/63 | HR 58 | Temp 97.7°F | Ht 70.0 in | Wt 189.4 lb

## 2010-10-02 DIAGNOSIS — Z85038 Personal history of other malignant neoplasm of large intestine: Secondary | ICD-10-CM
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
## 2010-10-02 NOTE — Patient Instructions (Signed)
We have set you up for a colonoscopy in the near future.  No change in medications currently.  We will be in touch soon!

## 2010-10-03 ENCOUNTER — Encounter: Payer: Self-pay | Admitting: Gastroenterology

## 2010-10-03 DIAGNOSIS — Z85038 Personal history of other malignant neoplasm of large intestine: Secondary | ICD-10-CM | POA: Insufficient documentation

## 2010-10-03 NOTE — Assessment & Plan Note (Signed)
77 year old Caucasian male with hx of colon cancer, diagnosed in 2008 and s/p transverse colectomy by Dr. Daphine Deutscher. Last colonoscopy in 2009 with left-sided diverticula, normal rectum, tubular adenoma. Pt doing great without any problems currently. No abdominal pain, N/V. No melena or brbpr. No change in bowel habits. No wt loss. Due for updated surveillance.  Proceed with TCS with Dr. Jena Gauss in near future: the risks, benefits, and alternatives have been discussed with the patient in detail. The patient states understanding and desires to proceed.

## 2010-10-06 NOTE — Progress Notes (Signed)
Cc to PCP 

## 2010-10-27 MED ORDER — SODIUM CHLORIDE 0.45 % IV SOLN
Freq: Once | INTRAVENOUS | Status: AC
  Administered 2010-10-28: 13:00:00 via INTRAVENOUS

## 2010-10-28 ENCOUNTER — Encounter: Payer: Medicare Other | Admitting: Internal Medicine

## 2010-10-28 ENCOUNTER — Encounter (HOSPITAL_COMMUNITY): Payer: Self-pay | Admitting: *Deleted

## 2010-10-28 ENCOUNTER — Encounter (HOSPITAL_COMMUNITY)
Admission: RE | Disposition: A | Discharge status: Home / Self Care | Payer: Self-pay | Source: Ambulatory Visit | Attending: Internal Medicine

## 2010-10-28 ENCOUNTER — Ambulatory Visit (HOSPITAL_COMMUNITY)
Admission: RE | Admit: 2010-10-28 | Discharge: 2010-10-28 | Disposition: A | Discharge status: Home / Self Care | Payer: Medicare Other | Source: Ambulatory Visit | Attending: Internal Medicine | Admitting: Internal Medicine

## 2010-10-28 DIAGNOSIS — Z9049 Acquired absence of other specified parts of digestive tract: Secondary | ICD-10-CM | POA: Insufficient documentation

## 2010-10-28 DIAGNOSIS — K5730 Diverticulosis of large intestine without perforation or abscess without bleeding: Secondary | ICD-10-CM

## 2010-10-28 DIAGNOSIS — Z79899 Other long term (current) drug therapy: Secondary | ICD-10-CM | POA: Insufficient documentation

## 2010-10-28 DIAGNOSIS — Z85038 Personal history of other malignant neoplasm of large intestine: Secondary | ICD-10-CM | POA: Insufficient documentation

## 2010-10-28 DIAGNOSIS — I10 Essential (primary) hypertension: Secondary | ICD-10-CM | POA: Insufficient documentation

## 2010-10-28 DIAGNOSIS — E7800 Pure hypercholesterolemia, unspecified: Secondary | ICD-10-CM | POA: Insufficient documentation

## 2010-10-28 DIAGNOSIS — Z09 Encounter for follow-up examination after completed treatment for conditions other than malignant neoplasm: Secondary | ICD-10-CM | POA: Insufficient documentation

## 2010-10-28 HISTORY — PX: COLONOSCOPY: SHX5424

## 2010-10-28 SURGERY — COLONOSCOPY
Anesthesia: Moderate Sedation

## 2010-10-28 MED ORDER — STERILE WATER FOR IRRIGATION IR SOLN
Status: DC | PRN
  Administered 2010-10-28: 13:00:00

## 2010-10-28 MED ORDER — MIDAZOLAM HCL 5 MG/5ML IJ SOLN
INTRAMUSCULAR | Status: AC
  Filled 2010-10-28: qty 10

## 2010-10-28 MED ORDER — MEPERIDINE HCL 100 MG/ML IJ SOLN
INTRAMUSCULAR | Status: AC
  Filled 2010-10-28: qty 2

## 2010-10-28 MED ORDER — MIDAZOLAM HCL 5 MG/5ML IJ SOLN
INTRAMUSCULAR | Status: DC | PRN
  Administered 2010-10-28: 2 mg via INTRAVENOUS

## 2010-10-28 MED ORDER — MEPERIDINE HCL 25 MG/ML IJ SOLN
INTRAMUSCULAR | Status: DC | PRN
  Administered 2010-10-28: 50 mg via INTRAVENOUS

## 2010-11-04 ENCOUNTER — Other Ambulatory Visit (HOSPITAL_COMMUNITY): Payer: Self-pay | Admitting: Oncology

## 2010-11-04 DIAGNOSIS — Z85038 Personal history of other malignant neoplasm of large intestine: Secondary | ICD-10-CM

## 2010-11-05 ENCOUNTER — Other Ambulatory Visit (HOSPITAL_COMMUNITY): Payer: Medicare Other

## 2010-11-10 ENCOUNTER — Encounter (HOSPITAL_COMMUNITY): Payer: Self-pay | Admitting: Internal Medicine

## 2010-11-28 ENCOUNTER — Ambulatory Visit (HOSPITAL_COMMUNITY): Payer: Self-pay

## 2010-12-02 ENCOUNTER — Ambulatory Visit (HOSPITAL_COMMUNITY): Payer: Medicare Other | Admitting: Oncology

## 2011-01-14 LAB — CEA: CEA: 1.4

## 2011-01-20 LAB — CEA: CEA: 1.8

## 2011-01-23 LAB — CEA: CEA: 1.4 ng/mL (ref 0.0–5.0)

## 2011-01-27 LAB — CEA: CEA: 0.9

## 2011-01-30 LAB — CEA: CEA: 0.9

## 2011-01-30 LAB — CBC
MCV: 70.9 — ABNORMAL LOW
Platelets: 209
RBC: 5.13
WBC: 7.5

## 2011-02-02 LAB — PREPARE RBC (CROSSMATCH)

## 2011-02-02 LAB — TYPE AND SCREEN

## 2011-02-02 LAB — BASIC METABOLIC PANEL
Calcium: 8.4
Chloride: 107
Creatinine, Ser: 0.9
GFR calc Af Amer: >60
GFR calc Af Amer: >60
GFR calc non Af Amer: >60
GFR calc non Af Amer: >60
Potassium: 3.7

## 2011-02-02 LAB — CREATININE, SERUM
Creatinine, Ser: 0.79
GFR calc Af Amer: >60

## 2011-02-02 LAB — CBC
HCT: 31.3 — ABNORMAL LOW
MCHC: 31
MCV: 68.6 — ABNORMAL LOW
Platelets: 213
RBC: 3.58 — ABNORMAL LOW
RBC: 3.96 — ABNORMAL LOW
RBC: 4.56
RDW: 18.4 — ABNORMAL HIGH
WBC: 7
WBC: 9.7

## 2011-02-03 LAB — CBC
HCT: 26.2 — ABNORMAL LOW
MCHC: 31
MCV: 63.9 — ABNORMAL LOW
RBC: 4.1 — ABNORMAL LOW

## 2011-02-03 LAB — BASIC METABOLIC PANEL
BUN: 6
CO2: 28
Chloride: 110
Creatinine, Ser: 0.7
GFR calc Af Amer: >60
Potassium: 4.3

## 2011-03-05 ENCOUNTER — Ambulatory Visit (HOSPITAL_COMMUNITY): Payer: Medicare Other

## 2011-03-05 ENCOUNTER — Ambulatory Visit (HOSPITAL_COMMUNITY): Payer: Medicare Other | Admitting: Oncology

## 2011-03-05 ENCOUNTER — Encounter (HOSPITAL_COMMUNITY): Payer: Medicare Other | Attending: Oncology | Admitting: Oncology

## 2011-03-05 ENCOUNTER — Encounter (HOSPITAL_COMMUNITY): Payer: Self-pay | Admitting: Oncology

## 2011-03-05 VITALS — BP 137/66 | HR 47 | Temp 97.4°F | Ht 70.0 in | Wt 183.0 lb

## 2011-03-05 DIAGNOSIS — R972 Elevated prostate specific antigen [PSA]: Secondary | ICD-10-CM | POA: Insufficient documentation

## 2011-03-05 DIAGNOSIS — Z09 Encounter for follow-up examination after completed treatment for conditions other than malignant neoplasm: Secondary | ICD-10-CM | POA: Insufficient documentation

## 2011-03-05 DIAGNOSIS — Z85038 Personal history of other malignant neoplasm of large intestine: Secondary | ICD-10-CM | POA: Insufficient documentation

## 2011-03-05 DIAGNOSIS — F0390 Unspecified dementia without behavioral disturbance: Secondary | ICD-10-CM | POA: Insufficient documentation
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
## 2011-03-05 NOTE — Patient Instructions (Signed)
[facility]  Discharge Instructions Andre Pollard  [id number] 05-Dec-1929  RECOMMENDATIONS MADE BY THE CONSULTANT AND ANY TEST RESULTS WILL BE SENT TO YOUR REFERRING DOCTOR.   EXAM FINDINGS BY MD TODAY AND SIGNS AND SYMPTOMS TO REPORT TO CLINIC OR PRIMARY MD: exam good  *   INSTRUCTIONS GIVEN AND DISCUSSED: Other : labs every 3 months---cea can be done at [facility]  SPECIAL INSTRUCTIONS/FOLLOW-UP: One year   I acknowledge that I have been informed and understand all the instructions given to me and received a copy. I do not have any more questions at this time, but understand that I may call the Specialty Clinic at [facility] at [phone] during business hours should I have any further questions or need assistance in obtaining follow-up care.    __________________________________________  _____________  __________ Signature of Patient or Authorized Representative            Date                   Time    __________________________________________ Nurse's Signature

## 2011-03-06 LAB — CEA: CEA: 1.8 ng/mL (ref 0.0–5.0)

## 2011-03-09 ENCOUNTER — Telehealth (HOSPITAL_COMMUNITY): Payer: Self-pay

## 2011-03-09 NOTE — Telephone Encounter (Signed)
Notified CEA stable @ 1.8.

## 2011-04-17 ENCOUNTER — Ambulatory Visit (INDEPENDENT_AMBULATORY_CARE_PROVIDER_SITE_OTHER): Payer: Medicare Other | Admitting: Urology

## 2011-04-17 DIAGNOSIS — N401 Enlarged prostate with lower urinary tract symptoms: Secondary | ICD-10-CM

## 2011-04-17 DIAGNOSIS — R972 Elevated prostate specific antigen [PSA]: Secondary | ICD-10-CM

## 2011-06-10 ENCOUNTER — Telehealth: Payer: Self-pay | Admitting: General Practice

## 2011-06-10 NOTE — Telephone Encounter (Signed)
Message copied by Jennings Books on Wed Jun 10, 2011  4:21 PM ------      Message from: Diana Eves D      Created: Wed Jun 10, 2011  4:09 PM      Regarding: BILLING/INSURANCE/CODING QUESTION      Contact: [phone]       Rivka Barbara the daughter of Quinnten Calvin has question about billing/insurance and coding. She can be reached at [phone] this call was taken on 06/09/11 at 3:02 pm

## 2011-06-10 NOTE — Telephone Encounter (Signed)
I spoke with the patient's daughter glenda, about her father's bill. I also explained to the pts daughter that I needed to investigate this issue further with a third party company and I would get back to her tomorrow before 5pm.   I have also reached out to [employer] to follow-up on why this claim has not been filed with Medicare.

## 2011-06-11 NOTE — Telephone Encounter (Signed)
I called and explained to [facility] that we have a new EHR and we have been dealing with issues about claims not being filed.  I have spoken with Richard at [facility] and that claim for Mr. Collington was sent to [facility] today.

## 2011-09-07 ENCOUNTER — Ambulatory Visit (HOSPITAL_COMMUNITY): Payer: Medicare Other | Admitting: Oncology

## 2011-10-01 ENCOUNTER — Encounter (HOSPITAL_COMMUNITY): Payer: Medicare Other | Attending: Oncology

## 2011-10-01 DIAGNOSIS — Z85038 Personal history of other malignant neoplasm of large intestine: Secondary | ICD-10-CM

## 2011-10-01 NOTE — Progress Notes (Signed)
Labs drawn today for cea 

## 2011-10-13 ENCOUNTER — Encounter (HOSPITAL_COMMUNITY): Payer: Self-pay | Admitting: Emergency Medicine

## 2011-10-13 ENCOUNTER — Emergency Department (HOSPITAL_COMMUNITY)
Admission: EM | Admit: 2011-10-13 | Discharge: 2011-10-13 | Disposition: A | Discharge status: Home / Self Care | Payer: Medicare Other | Attending: Emergency Medicine | Admitting: Emergency Medicine

## 2011-10-13 DIAGNOSIS — I10 Essential (primary) hypertension: Secondary | ICD-10-CM | POA: Insufficient documentation

## 2011-10-13 DIAGNOSIS — Z9889 Other specified postprocedural states: Secondary | ICD-10-CM | POA: Insufficient documentation

## 2011-10-13 DIAGNOSIS — F0390 Unspecified dementia without behavioral disturbance: Secondary | ICD-10-CM | POA: Insufficient documentation

## 2011-10-13 DIAGNOSIS — Z85820 Personal history of malignant melanoma of skin: Secondary | ICD-10-CM | POA: Insufficient documentation

## 2011-10-13 DIAGNOSIS — Z85038 Personal history of other malignant neoplasm of large intestine: Secondary | ICD-10-CM | POA: Insufficient documentation

## 2011-10-13 DIAGNOSIS — E7800 Pure hypercholesterolemia, unspecified: Secondary | ICD-10-CM | POA: Insufficient documentation

## 2011-10-13 DIAGNOSIS — R339 Retention of urine, unspecified: Secondary | ICD-10-CM | POA: Insufficient documentation

## 2011-10-13 HISTORY — DX: Unspecified dementia without behavioral disturbance: F03.90

## 2011-10-13 HISTORY — DX: Elevated prostate specific antigen (PSA): R97.20

## 2011-10-13 HISTORY — DX: Unspecified dementia, mild, without behavioral disturbance, psychotic disturbance, mood disturbance, and anxiety: F03.A0

## 2011-10-13 LAB — URINALYSIS, ROUTINE W REFLEX MICROSCOPIC
Bilirubin Urine: NEGATIVE
Glucose, UA: NEGATIVE mg/dL
Hgb urine dipstick: NEGATIVE
Specific Gravity, Urine: 1.01 (ref 1.005–1.030)
Urobilinogen, UA: 0.2 mg/dL (ref 0.0–1.0)

## 2011-10-13 NOTE — Discharge Instructions (Signed)
RESOURCE GUIDE  Chronic Pain Problems: Contact [facility]  [phone] Patients need to be referred by their primary care doctor.  Insufficient Money for Medicine: Contact United Way:  call "211" or [facility] [phone].  No Primary Care Doctor: - Call Health Connect  [phone] - can help you locate a primary care doctor that  accepts your insurance, provides certain services, etc. - Physician Referral Service- 1-800-533-3463  Agencies that provide inexpensive medical care: - [facility]  [phone] - [facility]  [phone] - [facility]  [phone] - Women's Clinic  [phone] - Planned Parenthood  [phone] - [facility]  [phone]  Medicaid-accepting [place] Providers: - [facility]- [address], [address]  [phone], Mon-Fri 9am-7pm, Sat 9am-1pm - [facility]- [address], [address]  [phone] - [facility]- [address], [address]  [phone] - [facility]- [address] [place] [address]  [phone] - Veita Bland- [address], [phone]  Only accepts [place] Access Medicaid patients after they have their name  applied to their card  Self Pay (no insurance) in [place]: - Sickle Cell Patients: Andre Pollard, [facility]  [address], [phone] - [facility]- [address]  [phone]       -     [facility]- [address]       -     [facility]- see information above (Speak to Andre Pollard if you do not have insurance)       -  [facility]- [address], [phone]       -  [facility] [place]- [address],  [phone]       -  [facility]- [address] [place] [address], [phone]       -  Andre Pollard-  [address], [address], [place], [phone]       -  [facility]- [address], [phone]       -  [facility]- [address] [place] [address], [phone], also [address], [phone]       -    [facility]- [address], [phone], 1st & 3rd Saturday   every month, 10am-1pm  1) Find a Doctor and Pay Out of Pocket Although you won't have to find out who is covered by your insurance plan, it is a good idea to ask around and get recommendations. You will then need to call the office and see if the doctor you have chosen will accept you as a new patient and what types of options they offer for patients who are self-pay. Some doctors offer discounts or will set up payment plans for their patients who do not have insurance, but you will need to ask so you aren't surprised when you get to your appointment.  2) Contact Your Local Health Department Not all health departments have doctors that can see patients for sick visits, but many do, so it is worth a call to see if yours does. If you don't know where your local health department is, you can check in your phone book. The CDC also has a tool to help you locate your state's health department, and many state websites also have   listings of all of their local health departments.  3) Find a Walk-in Clinic If your illness is not likely to be very severe or complicated, you may want to try a walk in clinic. These are popping up all over the country in pharmacies, drugstores, and shopping centers. They're usually staffed by nurse practitioners or physician assistants that have been trained to treat common illnesses and complaints. They're usually fairly quick and inexpensive. However, if you have serious medical issues or chronic medical problems, these are probably not your best option  STD Testing - [place] [facility] [place], [facility] Clinic, [address], [place], phone [phone] or 1-877-539-9860.  Monday - Friday, call for an appointment. - [place]  [facility] [place], STD Clinic, [address], [place], phone [phone] or 1-877-539-9860.  Monday - Friday, call for an appointment.  Abuse/Neglect: - [place] Child Abuse Hotline (336) 641-3795 - [place] Child Abuse Hotline 800-378-5315 (After Hours)  Emergency Shelter:  [facility] (336) 271-5985  Maternity Homes: - [facility] (336) 275-9566 - [facility] (704) 372-4663  MRSA Hotline #:   832-7006  [place] Resources  [facility] [place]  [employer] [place] [facility] [address]                 [address]         [address]  [place]                                               [place]                              [place] Phone:  [phone]                                  Phone:  [phone]                   Phone:  [phone]  [place] [facility], [phone] - [place] Services - [facility]- 1-888-581-9988       -     [facility] in [place], [address],                                  336-349-4454, Insurance  [place] Child Abuse Hotline (336) 342-1394 or (336) 342-3537 (After Hours)   Behavioral Health Services  Substance Abuse Resources: - [facility]  336-882-2125 - [facility] 336-784-9470 - [facility] 336-285-9073 - [facility] 336-845-3988 - Residential & Outpatient Substance Abuse Program  800-659-3381  Psychological Services: -  Health  [phone] - [facility]  [phone] - [place] [facility], [address], [place], ACCESS LINE: 1-800-853-5163 or 336-641-4981, Http://www.[facility].com/services/adult.htm  Dental Assistance  If unable to pay or uninsured, contact:  [facility] or [place] [facility] to become qualified for the adult dental  clinic.  Patients with Medicaid:  [facility] [address], [phone], [phone]  If unable   to pay, or uninsured, contact [facility] 848-612-1259) or [facility] (313)881-1469 in [place], [phone] in [facility]) to become qualified for the adult dental clinic  Other Low-Cost Community Dental Services: - [facility]- [address] [place], [place], [place], [address], [phone], [address]. [address], 2nd and 4th Thursday of the month at 6:30am.  10 clients each day by appointment, can sometimes see walk-in patients if someone does not show for an appointment. [facility]- [address] [place], [place], [address], [phone] - [facility]- [address] [place], [place], [address], [phone] [facility]- [phone] [facility]- [phone] [facility] [phone]     Take your usual prescriptions as previously directed.  Call your Urologist tomorrow morning to confirm your previously scheduled appointment for this week.  Return to the Emergency Department immediately sooner if worsening.

## 2011-10-13 NOTE — ED Notes (Signed)
Pt has melanoma removed from his face today. Was discharged today and states to not have urinated since 3pm today. Stated that the nurse on call where he had his surgery advised him to come to the ED to get catheterized. Pt complains of pressure and states he feels like his bladder is full.
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
## 2011-10-16 ENCOUNTER — Ambulatory Visit (INDEPENDENT_AMBULATORY_CARE_PROVIDER_SITE_OTHER): Payer: Medicare Other | Admitting: Urology

## 2011-10-16 DIAGNOSIS — N138 Other obstructive and reflux uropathy: Secondary | ICD-10-CM

## 2011-10-16 DIAGNOSIS — N401 Enlarged prostate with lower urinary tract symptoms: Secondary | ICD-10-CM

## 2011-10-16 DIAGNOSIS — R972 Elevated prostate specific antigen [PSA]: Secondary | ICD-10-CM

## 2011-10-16 DIAGNOSIS — R338 Other retention of urine: Secondary | ICD-10-CM

## 2011-10-17 LAB — URINE CULTURE: Colony Count: 50000

## 2011-10-17 NOTE — ED Provider Notes (Signed)
Charge rn at [facility] w positive urine cx, mrsa. Confirmed nkda x morphine for chart. rx bactrim ds, pcp follow up Monday.   Suzi Roots, MD 10/17/11 2045

## 2011-10-30 ENCOUNTER — Other Ambulatory Visit (HOSPITAL_COMMUNITY): Payer: Self-pay | Admitting: Neurosurgery

## 2011-10-30 DIAGNOSIS — D496 Neoplasm of unspecified behavior of brain: Secondary | ICD-10-CM

## 2011-11-02 ENCOUNTER — Ambulatory Visit (HOSPITAL_COMMUNITY)
Admission: RE | Admit: 2011-11-02 | Discharge: 2011-11-02 | Disposition: A | Discharge status: Home / Self Care | Payer: Medicare Other | Source: Ambulatory Visit | Attending: Neurosurgery | Admitting: Neurosurgery

## 2011-11-02 DIAGNOSIS — D496 Neoplasm of unspecified behavior of brain: Secondary | ICD-10-CM | POA: Insufficient documentation

## 2011-11-02 DIAGNOSIS — G930 Cerebral cysts: Secondary | ICD-10-CM | POA: Insufficient documentation

## 2011-11-02 LAB — POCT I-STAT, CHEM 8
BUN: 13 mg/dL (ref 6–23)
Calcium, Ion: 1.16 mmol/L (ref 1.13–1.30)
Chloride: 110 mEq/L (ref 96–112)
Creatinine, Ser: 0.9 mg/dL (ref 0.50–1.35)
TCO2: 22 mmol/L (ref 0–100)

## 2011-11-02 MED ORDER — GADOBENATE DIMEGLUMINE 529 MG/ML IV SOLN
17.0000 mL | Freq: Once | INTRAVENOUS | Status: AC | PRN
  Administered 2011-11-02: 17 mL via INTRAVENOUS

## 2011-11-02 NOTE — Progress Notes (Signed)
Blood sample obtained from left arm IV for Creatnine level.  

## 2012-01-15 ENCOUNTER — Ambulatory Visit (INDEPENDENT_AMBULATORY_CARE_PROVIDER_SITE_OTHER): Payer: Medicare Other | Admitting: Urology

## 2012-01-15 DIAGNOSIS — R972 Elevated prostate specific antigen [PSA]: Secondary | ICD-10-CM

## 2012-01-15 DIAGNOSIS — N401 Enlarged prostate with lower urinary tract symptoms: Secondary | ICD-10-CM

## 2012-03-04 ENCOUNTER — Encounter (HOSPITAL_COMMUNITY): Payer: Medicare Other | Attending: Oncology | Admitting: Oncology

## 2012-03-04 ENCOUNTER — Encounter (HOSPITAL_COMMUNITY): Payer: Self-pay | Admitting: Oncology

## 2012-03-04 VITALS — BP 147/61 | HR 70 | Temp 97.7°F | Resp 18 | Wt 189.7 lb

## 2012-03-04 DIAGNOSIS — C439 Malignant melanoma of skin, unspecified: Secondary | ICD-10-CM

## 2012-03-04 DIAGNOSIS — C4330 Malignant melanoma of unspecified part of face: Secondary | ICD-10-CM | POA: Insufficient documentation

## 2012-03-04 DIAGNOSIS — G9389 Other specified disorders of brain: Secondary | ICD-10-CM

## 2012-03-04 DIAGNOSIS — I10 Essential (primary) hypertension: Secondary | ICD-10-CM | POA: Insufficient documentation

## 2012-03-04 DIAGNOSIS — Z85038 Personal history of other malignant neoplasm of large intestine: Secondary | ICD-10-CM | POA: Insufficient documentation

## 2012-03-04 LAB — CBC WITH DIFFERENTIAL/PLATELET
Basophils Absolute: 0 10*3/uL (ref 0.0–0.1)
Basophils Relative: 0 % (ref 0–1)
Eosinophils Relative: 1 % (ref 0–5)
HCT: 42.1 % (ref 39.0–52.0)
Hemoglobin: 14.4 g/dL (ref 13.0–17.0)
Lymphocytes Relative: 25 % (ref 12–46)
MCHC: 34.2 g/dL (ref 30.0–36.0)
MCV: 87.9 fL (ref 78.0–100.0)
Monocytes Absolute: 0.6 10*3/uL (ref 0.1–1.0)
Monocytes Relative: 10 % (ref 3–12)
RDW: 13 % (ref 11.5–15.5)

## 2012-03-04 LAB — COMPREHENSIVE METABOLIC PANEL
AST: 17 U/L (ref 0–37)
BUN: 12 mg/dL (ref 6–23)
CO2: 29 mEq/L (ref 19–32)
Calcium: 9.6 mg/dL (ref 8.4–10.5)
Chloride: 103 mEq/L (ref 96–112)
Creatinine, Ser: 0.85 mg/dL (ref 0.50–1.35)
GFR calc Af Amer: >90 mL/min (ref >90)
GFR calc non Af Amer: 80 mL/min — ABNORMAL LOW (ref >90)
Total Bilirubin: 0.2 mg/dL — ABNORMAL LOW (ref 0.3–1.2)
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
## 2012-03-04 NOTE — Patient Instructions (Addendum)
[facility]  Discharge Instructions  RECOMMENDATIONS MADE BY THE CONSULTANT AND ANY TEST RESULTS WILL BE SENT TO YOUR REFERRING DOCTOR.   EXAM FINDINGS BY MD TODAY AND SIGNS AND SYMPTOMS TO REPORT TO CLINIC OR PRIMARY MD: Exam and discussion by MD.  Need to see dermatologist more frequently.  We will get records from [facility] related to the Melanoma.  MEDICATIONS PRESCRIBED: none   INSTRUCTIONS GIVEN AND DISCUSSED: Other :  Report changes in bowel habits, blood in your stool, etc.  SPECIAL INSTRUCTIONS/FOLLOW-UP: Lab work Needed today and every 6 months and Return to Clinic after labs in 6 months.   I acknowledge that I have been informed and understand all the instructions given to me and received a copy. I do not have any more questions at this time, but understand that I may call the Specialty Clinic at [facility] at [phone] during business hours should I have any further questions or need assistance in obtaining follow-up care.    __________________________________________  _____________  __________ Signature of Patient or Authorized Representative            Date                   Time    __________________________________________ Nurse's Signature

## 2012-07-15 ENCOUNTER — Ambulatory Visit (INDEPENDENT_AMBULATORY_CARE_PROVIDER_SITE_OTHER): Payer: Medicare Other | Admitting: Urology

## 2012-07-15 DIAGNOSIS — R972 Elevated prostate specific antigen [PSA]: Secondary | ICD-10-CM

## 2012-07-15 DIAGNOSIS — R310 Gross hematuria: Secondary | ICD-10-CM

## 2012-07-15 DIAGNOSIS — N401 Enlarged prostate with lower urinary tract symptoms: Secondary | ICD-10-CM

## 2012-08-29 ENCOUNTER — Encounter (HOSPITAL_COMMUNITY): Payer: Medicare Other | Attending: Oncology

## 2012-08-29 DIAGNOSIS — Z09 Encounter for follow-up examination after completed treatment for conditions other than malignant neoplasm: Secondary | ICD-10-CM | POA: Insufficient documentation

## 2012-08-29 DIAGNOSIS — C439 Malignant melanoma of skin, unspecified: Secondary | ICD-10-CM

## 2012-08-29 DIAGNOSIS — I10 Essential (primary) hypertension: Secondary | ICD-10-CM | POA: Insufficient documentation

## 2012-08-29 DIAGNOSIS — Z85038 Personal history of other malignant neoplasm of large intestine: Secondary | ICD-10-CM | POA: Insufficient documentation

## 2012-08-29 LAB — CBC WITH DIFFERENTIAL/PLATELET
Basophils Absolute: 0 10*3/uL (ref 0.0–0.1)
Lymphocytes Relative: 28 % (ref 12–46)
Lymphs Abs: 1.5 10*3/uL (ref 0.7–4.0)
MCV: 88.6 fL (ref 78.0–100.0)
Neutro Abs: 3.2 10*3/uL (ref 1.7–7.7)
Neutrophils Relative %: 60 % (ref 43–77)
Platelets: 170 10*3/uL (ref 150–400)
RBC: 4.81 MIL/uL (ref 4.22–5.81)
RDW: 13 % (ref 11.5–15.5)
WBC: 5.3 10*3/uL (ref 4.0–10.5)

## 2012-08-29 LAB — COMPREHENSIVE METABOLIC PANEL
ALT: 16 U/L (ref 0–53)
AST: 16 U/L (ref 0–37)
Alkaline Phosphatase: 84 U/L (ref 39–117)
CO2: 26 mEq/L (ref 19–32)
Calcium: 8.8 mg/dL (ref 8.4–10.5)
Chloride: 106 mEq/L (ref 96–112)
GFR calc Af Amer: >90 mL/min (ref >90)
GFR calc non Af Amer: 82 mL/min — ABNORMAL LOW (ref >90)
Glucose, Bld: 112 mg/dL — ABNORMAL HIGH (ref 70–99)
Potassium: 3.9 mEq/L (ref 3.5–5.1)
Sodium: 140 mEq/L (ref 135–145)

## 2012-08-29 LAB — CEA: CEA: 2.1 ng/mL (ref 0.0–5.0)

## 2012-08-29 NOTE — Progress Notes (Signed)
Labs drawn today for cbc/diff,cmp,cea 

## 2012-08-31 ENCOUNTER — Encounter (HOSPITAL_BASED_OUTPATIENT_CLINIC_OR_DEPARTMENT_OTHER): Payer: Medicare Other | Admitting: Oncology

## 2012-08-31 ENCOUNTER — Encounter (HOSPITAL_COMMUNITY): Payer: Self-pay | Admitting: Oncology

## 2012-08-31 VITALS — BP 116/54 | HR 65 | Temp 98.1°F | Resp 18 | Wt 189.6 lb

## 2012-08-31 DIAGNOSIS — L03818 Cellulitis of other sites: Secondary | ICD-10-CM

## 2012-08-31 DIAGNOSIS — G930 Cerebral cysts: Secondary | ICD-10-CM

## 2012-08-31 DIAGNOSIS — Z85038 Personal history of other malignant neoplasm of large intestine: Secondary | ICD-10-CM

## 2012-08-31 DIAGNOSIS — C439 Malignant melanoma of skin, unspecified: Secondary | ICD-10-CM

## 2012-08-31 DIAGNOSIS — C4330 Malignant melanoma of unspecified part of face: Secondary | ICD-10-CM

## 2012-08-31 NOTE — Patient Instructions (Addendum)
[facility] Discharge Instructions  RECOMMENDATIONS MADE BY THE CONSULTANT AND ANY TEST RESULTS WILL BE SENT TO YOUR REFERRING PHYSICIAN.  EXAM FINDINGS BY THE PHYSICIAN TODAY AND SIGNS OR SYMPTOMS TO REPORT TO CLINIC OR PRIMARY PHYSICIAN: Exam and discussion by MD.  Your blood counts are good and your tumor marker CEA is normal.  You need to see Dr. Wenda Low, the surgeon that did your colon surgery about a possible stitch abscess in your abdominal incision.  See your dermatologist about the areas on your face and neck.  MEDICATIONS PRESCRIBED:  none  INSTRUCTIONS GIVEN AND DISCUSSED: Report changes in your bowel habits, blood in your bowel movements or other problems.  SPECIAL INSTRUCTIONS/FOLLOW-UP: Continue your follow-up at [facility],  Blood work here in 6 months To be seen in follow-up in 6 months after your blood work.  Thank you for choosing [facility] to provide your oncology and hematology care.  To afford each patient quality time with our providers, please arrive at least 15 minutes before your scheduled appointment time.  With your help, our goal is to use those 15 minutes to complete the necessary work-up to ensure our physicians have the information they need to help with your evaluation and healthcare recommendations.    Effective January 1st, 2014, we ask that you re-schedule your appointment with our physicians should you arrive 10 or more minutes late for your appointment.  We strive to give you quality time with our providers, and arriving late affects you and other patients whose appointments are after yours.    Again, thank you for choosing [facility].  Our hope is that these requests will decrease the amount of time that you wait before being seen by our physicians.       _____________________________________________________________  Should you have questions after your visit to [facility], please  contact our office at [phone] between the hours of 8:30 a.m. and 5:00 p.m.  Voicemails left after 4:30 p.m. will not be returned until the following business day.  For prescription refill requests, have your pharmacy contact our office with your prescription refill request.
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
## 2012-09-09 DIAGNOSIS — C439 Malignant melanoma of skin, unspecified: Secondary | ICD-10-CM | POA: Insufficient documentation

## 2012-12-15 ENCOUNTER — Ambulatory Visit (HOSPITAL_COMMUNITY)
Admission: RE | Admit: 2012-12-15 | Discharge: 2012-12-15 | Disposition: A | Discharge status: Home / Self Care | Payer: Medicare Other | Source: Ambulatory Visit | Attending: Pulmonary Disease | Admitting: Pulmonary Disease

## 2012-12-15 ENCOUNTER — Other Ambulatory Visit (HOSPITAL_COMMUNITY): Payer: Self-pay | Admitting: Pulmonary Disease

## 2012-12-15 DIAGNOSIS — M549 Dorsalgia, unspecified: Secondary | ICD-10-CM

## 2012-12-15 DIAGNOSIS — M545 Low back pain, unspecified: Secondary | ICD-10-CM | POA: Insufficient documentation

## 2013-02-08 DIAGNOSIS — C439 Malignant melanoma of skin, unspecified: Secondary | ICD-10-CM | POA: Insufficient documentation

## 2013-02-08 HISTORY — DX: Malignant melanoma of skin, unspecified: C43.9

## 2013-02-17 ENCOUNTER — Ambulatory Visit (INDEPENDENT_AMBULATORY_CARE_PROVIDER_SITE_OTHER): Payer: Medicare Other | Admitting: Urology

## 2013-02-17 ENCOUNTER — Encounter (INDEPENDENT_AMBULATORY_CARE_PROVIDER_SITE_OTHER): Payer: Self-pay

## 2013-02-17 DIAGNOSIS — N401 Enlarged prostate with lower urinary tract symptoms: Secondary | ICD-10-CM

## 2013-02-17 DIAGNOSIS — R972 Elevated prostate specific antigen [PSA]: Secondary | ICD-10-CM

## 2013-02-26 ENCOUNTER — Encounter (HOSPITAL_COMMUNITY): Payer: Self-pay | Admitting: Emergency Medicine

## 2013-02-26 ENCOUNTER — Emergency Department (HOSPITAL_COMMUNITY)
Admission: EM | Admit: 2013-02-26 | Discharge: 2013-02-27 | Disposition: A | Discharge status: Home / Self Care | Payer: Medicare Other | Attending: Emergency Medicine | Admitting: Emergency Medicine

## 2013-02-26 DIAGNOSIS — Z7982 Long term (current) use of aspirin: Secondary | ICD-10-CM | POA: Insufficient documentation

## 2013-02-26 DIAGNOSIS — R300 Dysuria: Secondary | ICD-10-CM | POA: Insufficient documentation

## 2013-02-26 DIAGNOSIS — Z79899 Other long term (current) drug therapy: Secondary | ICD-10-CM | POA: Insufficient documentation

## 2013-02-26 DIAGNOSIS — Z85038 Personal history of other malignant neoplasm of large intestine: Secondary | ICD-10-CM | POA: Insufficient documentation

## 2013-02-26 DIAGNOSIS — Z85828 Personal history of other malignant neoplasm of skin: Secondary | ICD-10-CM | POA: Insufficient documentation

## 2013-02-26 DIAGNOSIS — Z8669 Personal history of other diseases of the nervous system and sense organs: Secondary | ICD-10-CM | POA: Insufficient documentation

## 2013-02-26 DIAGNOSIS — E7800 Pure hypercholesterolemia, unspecified: Secondary | ICD-10-CM | POA: Insufficient documentation

## 2013-02-26 DIAGNOSIS — R3915 Urgency of urination: Secondary | ICD-10-CM | POA: Insufficient documentation

## 2013-02-26 DIAGNOSIS — R338 Other retention of urine: Secondary | ICD-10-CM

## 2013-02-26 DIAGNOSIS — R109 Unspecified abdominal pain: Secondary | ICD-10-CM | POA: Insufficient documentation

## 2013-02-26 DIAGNOSIS — R339 Retention of urine, unspecified: Secondary | ICD-10-CM | POA: Insufficient documentation

## 2013-02-26 DIAGNOSIS — Z85820 Personal history of malignant melanoma of skin: Secondary | ICD-10-CM | POA: Insufficient documentation

## 2013-02-26 DIAGNOSIS — N400 Enlarged prostate without lower urinary tract symptoms: Secondary | ICD-10-CM | POA: Insufficient documentation

## 2013-02-26 DIAGNOSIS — F0390 Unspecified dementia without behavioral disturbance: Secondary | ICD-10-CM | POA: Insufficient documentation

## 2013-02-26 DIAGNOSIS — I10 Essential (primary) hypertension: Secondary | ICD-10-CM | POA: Insufficient documentation

## 2013-02-26 LAB — URINALYSIS, ROUTINE W REFLEX MICROSCOPIC
Bilirubin Urine: NEGATIVE
Ketones, ur: NEGATIVE mg/dL
Nitrite: NEGATIVE
Specific Gravity, Urine: <1.005 — ABNORMAL LOW (ref 1.005–1.030)
Urobilinogen, UA: 0.2 mg/dL (ref 0.0–1.0)

## 2013-02-26 LAB — URINE MICROSCOPIC-ADD ON
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
## 2013-02-26 NOTE — ED Notes (Signed)
Pt reporting difficulty with urination since Friday after he underwent outpatient surgical procedure for cancer.

## 2013-02-27 ENCOUNTER — Ambulatory Visit (HOSPITAL_COMMUNITY): Payer: Medicare Other | Admitting: Oncology

## 2013-02-27 NOTE — ED Notes (Signed)
Foley cath inserted.  Draining clear yellow urine.  drained at this time

## 2013-03-03 ENCOUNTER — Encounter (HOSPITAL_COMMUNITY): Payer: Medicare Other | Attending: Hematology and Oncology

## 2013-03-03 ENCOUNTER — Ambulatory Visit (INDEPENDENT_AMBULATORY_CARE_PROVIDER_SITE_OTHER): Payer: Medicare Other | Admitting: Urology

## 2013-03-03 DIAGNOSIS — Z09 Encounter for follow-up examination after completed treatment for conditions other than malignant neoplasm: Secondary | ICD-10-CM | POA: Insufficient documentation

## 2013-03-03 DIAGNOSIS — N401 Enlarged prostate with lower urinary tract symptoms: Secondary | ICD-10-CM

## 2013-03-03 DIAGNOSIS — R338 Other retention of urine: Secondary | ICD-10-CM

## 2013-03-03 DIAGNOSIS — C4330 Malignant melanoma of unspecified part of face: Secondary | ICD-10-CM

## 2013-03-03 DIAGNOSIS — Z85820 Personal history of malignant melanoma of skin: Secondary | ICD-10-CM | POA: Insufficient documentation

## 2013-03-03 DIAGNOSIS — Z85038 Personal history of other malignant neoplasm of large intestine: Secondary | ICD-10-CM

## 2013-03-03 LAB — COMPREHENSIVE METABOLIC PANEL
BUN: 14 mg/dL (ref 6–23)
CO2: 28 mEq/L (ref 19–32)
Calcium: 9.4 mg/dL (ref 8.4–10.5)
Chloride: 105 mEq/L (ref 96–112)
Creatinine, Ser: 0.86 mg/dL (ref 0.50–1.35)
GFR calc Af Amer: >90 mL/min (ref >90)
GFR calc non Af Amer: 79 mL/min — ABNORMAL LOW (ref >90)
Glucose, Bld: 108 mg/dL — ABNORMAL HIGH (ref 70–99)
Total Bilirubin: 0.3 mg/dL (ref 0.3–1.2)

## 2013-03-03 LAB — CBC WITH DIFFERENTIAL/PLATELET
Eosinophils Relative: 2 % (ref 0–5)
HCT: 42.3 % (ref 39.0–52.0)
Hemoglobin: 14.1 g/dL (ref 13.0–17.0)
Lymphocytes Relative: 20 % (ref 12–46)
MCHC: 33.3 g/dL (ref 30.0–36.0)
MCV: 89.8 fL (ref 78.0–100.0)
Monocytes Absolute: 0.7 10*3/uL (ref 0.1–1.0)
Monocytes Relative: 11 % (ref 3–12)
Neutro Abs: 4.4 10*3/uL (ref 1.7–7.7)
WBC: 6.5 10*3/uL (ref 4.0–10.5)

## 2013-03-03 NOTE — Progress Notes (Signed)
Labs drawn today for cbc/diff,cmp,cea 

## 2013-03-05 LAB — CEA: CEA: 1.6 ng/mL (ref 0.0–5.0)

## 2013-03-06 ENCOUNTER — Encounter (HOSPITAL_COMMUNITY): Payer: Self-pay

## 2013-03-06 ENCOUNTER — Encounter (HOSPITAL_BASED_OUTPATIENT_CLINIC_OR_DEPARTMENT_OTHER): Payer: Medicare Other

## 2013-03-06 VITALS — BP 161/79 | HR 105 | Temp 97.4°F | Resp 16 | Wt 190.9 lb

## 2013-03-06 DIAGNOSIS — Z85038 Personal history of other malignant neoplasm of large intestine: Secondary | ICD-10-CM

## 2013-03-06 DIAGNOSIS — C4330 Malignant melanoma of unspecified part of face: Secondary | ICD-10-CM

## 2013-03-06 DIAGNOSIS — C439 Malignant melanoma of skin, unspecified: Secondary | ICD-10-CM

## 2013-03-06 DIAGNOSIS — C189 Malignant neoplasm of colon, unspecified: Secondary | ICD-10-CM

## 2013-03-06 NOTE — Patient Instructions (Signed)
[facility] Discharge Instructions  RECOMMENDATIONS MADE BY THE CONSULTANT AND ANY TEST RESULTS WILL BE SENT TO YOUR REFERRING PHYSICIAN.  We will get you scheduled for a PET scan. Return to clinic in 2-3 weeks after PET scan for follow up.  Thank you for choosing [facility] to provide your oncology and hematology care.  To afford each patient quality time with our providers, please arrive at least 15 minutes before your scheduled appointment time.  With your help, our goal is to use those 15 minutes to complete the necessary work-up to ensure our physicians have the information they need to help with your evaluation and healthcare recommendations.    Effective January 1st, 2014, we ask that you re-schedule your appointment with our physicians should you arrive 10 or more minutes late for your appointment.  We strive to give you quality time with our providers, and arriving late affects you and other patients whose appointments are after yours.    Again, thank you for choosing [facility].  Our hope is that these requests will decrease the amount of time that you wait before being seen by our physicians.       _____________________________________________________________  Should you have questions after your visit to [facility], please contact our office at [phone] between the hours of 8:30 a.m. and 5:00 p.m.  Voicemails left after 4:30 p.m. will not be returned until the following business day.  For prescription refill requests, have your pharmacy contact our office with your prescription refill request.

## 2013-03-06 NOTE — Progress Notes (Signed)
Will request  Path to add BRAF/PD1/MEK studies to biopsy specimen- [facility]
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
## 2013-03-07 ENCOUNTER — Ambulatory Visit (INDEPENDENT_AMBULATORY_CARE_PROVIDER_SITE_OTHER): Payer: Medicare Other | Admitting: Urology

## 2013-03-07 DIAGNOSIS — R339 Retention of urine, unspecified: Secondary | ICD-10-CM

## 2013-03-08 ENCOUNTER — Ambulatory Visit (HOSPITAL_COMMUNITY): Admission: RE | Admit: 2013-03-08 | Payer: Medicare Other | Source: Ambulatory Visit

## 2013-03-09 ENCOUNTER — Encounter (HOSPITAL_COMMUNITY): Payer: Self-pay

## 2013-03-09 ENCOUNTER — Ambulatory Visit (HOSPITAL_COMMUNITY)
Admission: RE | Admit: 2013-03-09 | Discharge: 2013-03-09 | Disposition: A | Discharge status: Home / Self Care | Payer: Medicare Other | Source: Ambulatory Visit | Attending: Hematology and Oncology | Admitting: Hematology and Oncology

## 2013-03-09 DIAGNOSIS — C439 Malignant melanoma of skin, unspecified: Secondary | ICD-10-CM

## 2013-03-09 DIAGNOSIS — I6789 Other cerebrovascular disease: Secondary | ICD-10-CM | POA: Insufficient documentation

## 2013-03-09 DIAGNOSIS — C434 Malignant melanoma of scalp and neck: Secondary | ICD-10-CM | POA: Insufficient documentation

## 2013-03-09 MED ORDER — GADOBENATE DIMEGLUMINE 529 MG/ML IV SOLN
18.0000 mL | Freq: Once | INTRAVENOUS | Status: AC | PRN
  Administered 2013-03-09: 18 mL via INTRAVENOUS

## 2013-03-20 ENCOUNTER — Encounter (HOSPITAL_COMMUNITY)
Admission: RE | Admit: 2013-03-20 | Discharge: 2013-03-20 | Disposition: A | Discharge status: Home / Self Care | Payer: Medicare Other | Source: Ambulatory Visit | Attending: Hematology and Oncology | Admitting: Hematology and Oncology

## 2013-03-20 DIAGNOSIS — D3500 Benign neoplasm of unspecified adrenal gland: Secondary | ICD-10-CM | POA: Insufficient documentation

## 2013-03-20 DIAGNOSIS — N400 Enlarged prostate without lower urinary tract symptoms: Secondary | ICD-10-CM | POA: Insufficient documentation

## 2013-03-20 DIAGNOSIS — I7090 Unspecified atherosclerosis: Secondary | ICD-10-CM | POA: Insufficient documentation

## 2013-03-20 DIAGNOSIS — I2510 Atherosclerotic heart disease of native coronary artery without angina pectoris: Secondary | ICD-10-CM | POA: Insufficient documentation

## 2013-03-20 DIAGNOSIS — C439 Malignant melanoma of skin, unspecified: Secondary | ICD-10-CM | POA: Insufficient documentation

## 2013-03-20 LAB — GLUCOSE, CAPILLARY: Glucose-Capillary: 95 mg/dL (ref 70–99)

## 2013-03-20 MED ORDER — FLUDEOXYGLUCOSE F - 18 (FDG) INJECTION
19.8000 | Freq: Once | INTRAVENOUS | Status: AC | PRN
  Administered 2013-03-20: 19.8 via INTRAVENOUS

## 2013-03-22 ENCOUNTER — Encounter (HOSPITAL_COMMUNITY): Payer: Self-pay

## 2013-03-22 ENCOUNTER — Encounter (HOSPITAL_COMMUNITY): Payer: Medicare Other | Attending: Hematology and Oncology

## 2013-03-22 VITALS — BP 159/69 | HR 62 | Temp 97.4°F | Resp 16 | Wt 195.0 lb

## 2013-03-22 DIAGNOSIS — Z85038 Personal history of other malignant neoplasm of large intestine: Secondary | ICD-10-CM

## 2013-03-22 DIAGNOSIS — C4330 Malignant melanoma of unspecified part of face: Secondary | ICD-10-CM

## 2013-03-22 DIAGNOSIS — C439 Malignant melanoma of skin, unspecified: Secondary | ICD-10-CM

## 2013-03-22 DIAGNOSIS — Z09 Encounter for follow-up examination after completed treatment for conditions other than malignant neoplasm: Secondary | ICD-10-CM | POA: Insufficient documentation

## 2013-03-22 DIAGNOSIS — Z85820 Personal history of malignant melanoma of skin: Secondary | ICD-10-CM | POA: Insufficient documentation
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
## 2013-03-22 NOTE — Patient Instructions (Signed)
[facility] Discharge Instructions  RECOMMENDATIONS MADE BY THE CONSULTANT AND ANY TEST RESULTS WILL BE SENT TO YOUR REFERRING PHYSICIAN.  Return to clinic in 6 months. Lab work prior to MD appointment in 6 months.  Thank you for choosing [facility] to provide your oncology and hematology care.  To afford each patient quality time with our providers, please arrive at least 15 minutes before your scheduled appointment time.  With your help, our goal is to use those 15 minutes to complete the necessary work-up to ensure our physicians have the information they need to help with your evaluation and healthcare recommendations.    Effective January 1st, 2014, we ask that you re-schedule your appointment with our physicians should you arrive 10 or more minutes late for your appointment.  We strive to give you quality time with our providers, and arriving late affects you and other patients whose appointments are after yours.    Again, thank you for choosing [facility].  Our hope is that these requests will decrease the amount of time that you wait before being seen by our physicians.       _____________________________________________________________  Should you have questions after your visit to [facility], please contact our office at [phone] between the hours of 8:30 a.m. and 5:00 p.m.  Voicemails left after 4:30 p.m. will not be returned until the following business day.  For prescription refill requests, have your pharmacy contact our office with your prescription refill request.

## 2013-04-04 ENCOUNTER — Encounter (HOSPITAL_COMMUNITY): Payer: Self-pay | Admitting: Oncology

## 2013-04-26 ENCOUNTER — Encounter: Payer: Self-pay | Admitting: Neurology

## 2013-04-26 DIAGNOSIS — E785 Hyperlipidemia, unspecified: Secondary | ICD-10-CM | POA: Insufficient documentation

## 2013-04-26 DIAGNOSIS — C189 Malignant neoplasm of colon, unspecified: Secondary | ICD-10-CM

## 2013-04-26 DIAGNOSIS — N138 Other obstructive and reflux uropathy: Secondary | ICD-10-CM | POA: Insufficient documentation

## 2013-04-26 DIAGNOSIS — I10 Essential (primary) hypertension: Secondary | ICD-10-CM | POA: Insufficient documentation

## 2013-04-26 DIAGNOSIS — N401 Enlarged prostate with lower urinary tract symptoms: Secondary | ICD-10-CM

## 2013-04-27 ENCOUNTER — Ambulatory Visit (INDEPENDENT_AMBULATORY_CARE_PROVIDER_SITE_OTHER): Payer: Medicare Other | Admitting: Neurology

## 2013-04-27 ENCOUNTER — Encounter (INDEPENDENT_AMBULATORY_CARE_PROVIDER_SITE_OTHER): Payer: Self-pay

## 2013-04-27 ENCOUNTER — Encounter: Payer: Self-pay | Admitting: Neurology

## 2013-04-27 VITALS — BP 156/80 | HR 100 | Ht 70.0 in | Wt 190.0 lb

## 2013-04-27 DIAGNOSIS — Z85038 Personal history of other malignant neoplasm of large intestine: Secondary | ICD-10-CM

## 2013-04-27 DIAGNOSIS — I10 Essential (primary) hypertension: Secondary | ICD-10-CM

## 2013-04-27 DIAGNOSIS — R269 Unspecified abnormalities of gait and mobility: Secondary | ICD-10-CM | POA: Insufficient documentation

## 2013-04-27 DIAGNOSIS — C439 Malignant melanoma of skin, unspecified: Secondary | ICD-10-CM

## 2013-04-27 DIAGNOSIS — E785 Hyperlipidemia, unspecified: Secondary | ICD-10-CM
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
## 2013-05-03 ENCOUNTER — Ambulatory Visit
Admission: RE | Admit: 2013-05-03 | Discharge: 2013-05-03 | Disposition: A | Discharge status: Home / Self Care | Payer: Medicare Other | Source: Ambulatory Visit | Attending: Radiation Oncology | Admitting: Radiation Oncology

## 2013-05-03 ENCOUNTER — Encounter: Payer: Self-pay | Admitting: Radiation Oncology

## 2013-05-03 VITALS — BP 114/95 | HR 56 | Temp 97.4°F | Ht 70.0 in | Wt 190.8 lb

## 2013-05-03 DIAGNOSIS — Z79899 Other long term (current) drug therapy: Secondary | ICD-10-CM | POA: Insufficient documentation

## 2013-05-03 DIAGNOSIS — F0390 Unspecified dementia without behavioral disturbance: Secondary | ICD-10-CM | POA: Insufficient documentation

## 2013-05-03 DIAGNOSIS — I10 Essential (primary) hypertension: Secondary | ICD-10-CM | POA: Insufficient documentation

## 2013-05-03 DIAGNOSIS — H9190 Unspecified hearing loss, unspecified ear: Secondary | ICD-10-CM | POA: Insufficient documentation

## 2013-05-03 DIAGNOSIS — C4330 Malignant melanoma of unspecified part of face: Secondary | ICD-10-CM

## 2013-05-03 DIAGNOSIS — L299 Pruritus, unspecified: Secondary | ICD-10-CM | POA: Insufficient documentation

## 2013-05-03 DIAGNOSIS — C439 Malignant melanoma of skin, unspecified: Secondary | ICD-10-CM

## 2013-05-03 DIAGNOSIS — Z7982 Long term (current) use of aspirin: Secondary | ICD-10-CM | POA: Insufficient documentation

## 2013-05-03 DIAGNOSIS — E7800 Pure hypercholesterolemia, unspecified: Secondary | ICD-10-CM | POA: Insufficient documentation
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
## 2013-05-03 NOTE — Progress Notes (Signed)
Please see the Nurse Progress Note in the MD Initial Consult Encounter for this patient. 

## 2013-05-04 ENCOUNTER — Encounter (HOSPITAL_COMMUNITY): Payer: Self-pay

## 2013-05-04 ENCOUNTER — Ambulatory Visit
Admission: RE | Admit: 2013-05-04 | Discharge: 2013-05-04 | Disposition: A | Discharge status: Home / Self Care | Payer: Medicare Other | Source: Ambulatory Visit | Attending: Radiation Oncology | Admitting: Radiation Oncology

## 2013-05-04 ENCOUNTER — Telehealth: Payer: Self-pay | Admitting: Neurology

## 2013-05-04 ENCOUNTER — Ambulatory Visit (HOSPITAL_COMMUNITY)
Admission: RE | Admit: 2013-05-04 | Discharge: 2013-05-04 | Disposition: A | Discharge status: Home / Self Care | Payer: Medicare Other | Source: Ambulatory Visit | Attending: Neurology | Admitting: Neurology

## 2013-05-04 DIAGNOSIS — C189 Malignant neoplasm of colon, unspecified: Secondary | ICD-10-CM | POA: Insufficient documentation

## 2013-05-04 DIAGNOSIS — C439 Malignant melanoma of skin, unspecified: Secondary | ICD-10-CM

## 2013-05-04 DIAGNOSIS — Z7982 Long term (current) use of aspirin: Secondary | ICD-10-CM | POA: Insufficient documentation

## 2013-05-04 DIAGNOSIS — Z85038 Personal history of other malignant neoplasm of large intestine: Secondary | ICD-10-CM

## 2013-05-04 DIAGNOSIS — Z79899 Other long term (current) drug therapy: Secondary | ICD-10-CM | POA: Insufficient documentation

## 2013-05-04 DIAGNOSIS — I10 Essential (primary) hypertension: Secondary | ICD-10-CM

## 2013-05-04 DIAGNOSIS — E785 Hyperlipidemia, unspecified: Secondary | ICD-10-CM

## 2013-05-04 DIAGNOSIS — C4330 Malignant melanoma of unspecified part of face: Secondary | ICD-10-CM | POA: Insufficient documentation

## 2013-05-04 DIAGNOSIS — C434 Malignant melanoma of scalp and neck: Secondary | ICD-10-CM | POA: Insufficient documentation

## 2013-05-04 DIAGNOSIS — R269 Unspecified abnormalities of gait and mobility: Secondary | ICD-10-CM

## 2013-05-04 DIAGNOSIS — I6789 Other cerebrovascular disease: Secondary | ICD-10-CM | POA: Insufficient documentation

## 2013-05-04 DIAGNOSIS — G319 Degenerative disease of nervous system, unspecified: Secondary | ICD-10-CM | POA: Insufficient documentation

## 2013-05-04 LAB — POCT I-STAT CREATININE: CREATININE: 1 mg/dL (ref 0.50–1.35)

## 2013-05-04 MED ORDER — GADOBENATE DIMEGLUMINE 529 MG/ML IV SOLN
18.0000 mL | Freq: Once | INTRAVENOUS | Status: AC | PRN
  Administered 2013-05-04: 18 mL via INTRAVENOUS

## 2013-05-04 NOTE — Telephone Encounter (Signed)
I have called his daughter, MRI of brain showed atrophy, small vessel disease, no change from previous scan, but he has a right cheek growth, which is new since last MRI in November 2014

## 2013-05-06 NOTE — Progress Notes (Signed)
  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  Identity was confirmed.  All relevant records and images related to the planned course of therapy were reviewed.  The patient freely provided informed written consent to proceed with treatment after reviewing the details related to the planned course of therapy. The consent form was witnessed and verified by the simulation staff.  Then, the patient was set-up in a stable reproducible  neck flexed  position for radiation therapy.  a wire was placed around the area of recurrence with generous margin   CT images were obtained.  Surface markings were placed.  The CT images were loaded into the planning software.  Then the target and avoidance structures were contoured.  Treatment planning then occurred.  The radiation prescription was entered and confirmed.  Then, I designed and supervised the construction of a total of 2 medically necessary complex treatment devices.  I have requested : Isodose Plan.  I have ordered:dose calc.  PLAN:  The patient will receive 30 Gy in 10 fractions with a custom electron cutout field. Bolus will be placed daily to insure adequate dose to the superficial aspects of the target area.  A special port plan is requested for treatment.  ________________________________  -----------------------------------  Blair Promise, PhD, MD

## 2013-05-09 ENCOUNTER — Ambulatory Visit
Admission: RE | Admit: 2013-05-09 | Discharge: 2013-05-09 | Disposition: A | Discharge status: Home / Self Care | Payer: Medicare Other | Source: Ambulatory Visit | Attending: Radiation Oncology | Admitting: Radiation Oncology

## 2013-05-09 VITALS — BP 144/71 | HR 54 | Temp 97.4°F | Ht 70.0 in | Wt 191.7 lb

## 2013-05-09 DIAGNOSIS — C4330 Malignant melanoma of unspecified part of face: Secondary | ICD-10-CM

## 2013-05-09 NOTE — Progress Notes (Signed)
Andre     Rexene Pollard, M.D. [place], [place] [address]               Andre Pollard, M.D., Ph.D. Phone: [phone]      Andre Pollard, M.D. Fax: [phone]      Andre Pollard, M.D., Ph.D.         Andre Pollard, M.D.         Andre Pollard, M.D Weekly Treatment Management Note  Name: Andre Pollard     MRN: [id number]        CSN: [id number] Date: 05/09/2013      DOB: [birth year]  CC: Andre Bogus, MD         Andre Pollard    Status: Outpatient  Diagnosis: The encounter diagnosis was Malignant melanoma of skin of face.  Current Dose: 3 Gy  Current Fraction: 1  Planned Dose: 30 Gy  Narrative: Andre Pollard was seen today for weekly treatment management. The chart was checked and electron set-up was checked   were reviewed.  He is tolerating the treatments well thus far.  He has noticed some mild bleeding from the area of recurrence. This began happening  2-3 days before the start of this treatment.  Morphine and Morphine and related Current Outpatient Prescriptions  Medication Sig Dispense Refill  . aspirin 81 MG tablet Take 81 mg by mouth daily.        Marland Kitchen donepezil (ARICEPT) 10 MG tablet Take 10 mg by mouth every evening.       . metoprolol tartrate (LOPRESSOR) 25 MG tablet Take 25 mg by mouth daily.       . Naproxen Sodium (ALEVE PO) Take by mouth as needed.      . NON FORMULARY Take 5 mLs by mouth daily. Extra virgin oil      . PEG 3350-KCl-NaBcb-NaCl-NaSulf (PEG 3350/ELECTROLYTES PO) by Other route. PEG-3350 / ELECTROLYTES      . sertraline (ZOLOFT) 50 MG tablet Take 50 mg by mouth every evening.       . simvastatin (ZOCOR) 40 MG tablet Take 40 mg by mouth at bedtime.       . Tamsulosin HCl (FLOMAX) 0.4 MG CAPS Take 0.4 mg by mouth Daily.       No current facility-administered medications for this encounter.   Labs:  Lab Results  Component Value Date   WBC 6.5 03/03/2013   HGB 14.1 03/03/2013    HCT 42.3 03/03/2013   MCV 89.8 03/03/2013   PLT 193 03/03/2013   Lab Results  Component Value Date   CREATININE 1.00 05/04/2013   BUN 14 03/03/2013   NA 141 03/03/2013   K 3.8 03/03/2013   CL 105 03/03/2013   CO2 28 03/03/2013   Lab Results  Component Value Date   ALT 13 03/03/2013   AST 12 03/03/2013   BILITOT 0.3 03/03/2013    Physical Examination:  Filed Vitals:   05/09/13 1048  BP: 144/71  Pulse: 54  Temp: 97.4 F (36.3 C)    Wt Readings from Last 3 Encounters:  05/09/13 191 lb 11.2 oz (86.955 kg)  05/03/13 190 lb 12.8 oz (86.546 kg)  04/27/13 190 lb (86.183 kg)    No bleeding or drainage noted from the tumor mass along the right cheek. Lungs - Normal respiratory effort, chest expands symmetrically. Lungs are clear to auscultation, no crackles or wheezes.  Heart has regular rhythm and  rate  Abdomen is soft and non tender with normal bowel sounds  Assessment:  Patient tolerating treatments well  Plan: Continue treatment per original radiation prescription

## 2013-05-09 NOTE — Progress Notes (Signed)
Andre Pollard has had 1 fraction to his right cheek.  He reports having occasional sharp pains in the right side of his face that radiates to the top of his head.  He said it started in the last day or two and happens more when he lays down at night. He also reports that the tumors have been bleeding on his right check.  He was given the radiation therapy and you book and discussed potential side effects including skin changes, mouth changes and fatigue.  He was given biafine gel and was instructed to apply it twice a day.

## 2013-05-10 ENCOUNTER — Ambulatory Visit
Admission: RE | Admit: 2013-05-10 | Discharge: 2013-05-10 | Disposition: A | Discharge status: Home / Self Care | Payer: Medicare Other | Source: Ambulatory Visit | Attending: Radiation Oncology | Admitting: Radiation Oncology

## 2013-05-10 MED ORDER — BIAFINE EX EMUL
Freq: Two times a day (BID) | CUTANEOUS | Status: AC
  Administered 2013-05-10: 08:00:00 via TOPICAL

## 2013-05-10 NOTE — Addendum Note (Signed)
Encounter addended by: Jacqulyn Liner, RN on: 05/10/2013  8:30 AM<BR>     Documentation filed: Inpatient MAR

## 2013-05-10 NOTE — Addendum Note (Signed)
Encounter addended by: Jacqulyn Liner, RN on: 05/10/2013  8:30 AM<BR>     Documentation filed: Inpatient MAR, Orders

## 2013-05-11 ENCOUNTER — Ambulatory Visit
Admission: RE | Admit: 2013-05-11 | Discharge: 2013-05-11 | Disposition: A | Discharge status: Home / Self Care | Payer: Medicare Other | Source: Ambulatory Visit | Attending: Radiation Oncology | Admitting: Radiation Oncology

## 2013-05-12 ENCOUNTER — Ambulatory Visit
Admission: RE | Admit: 2013-05-12 | Discharge: 2013-05-12 | Disposition: A | Discharge status: Home / Self Care | Payer: Medicare Other | Source: Ambulatory Visit | Attending: Radiation Oncology | Admitting: Radiation Oncology

## 2013-05-15 ENCOUNTER — Ambulatory Visit
Admission: RE | Admit: 2013-05-15 | Discharge: 2013-05-15 | Disposition: A | Discharge status: Home / Self Care | Payer: Medicare Other | Source: Ambulatory Visit | Attending: Radiation Oncology | Admitting: Radiation Oncology

## 2013-05-16 ENCOUNTER — Ambulatory Visit
Admission: RE | Admit: 2013-05-16 | Discharge: 2013-05-16 | Disposition: A | Discharge status: Home / Self Care | Payer: Medicare Other | Source: Ambulatory Visit | Attending: Radiation Oncology | Admitting: Radiation Oncology

## 2013-05-16 VITALS — BP 161/61 | HR 55 | Temp 97.3°F | Ht 70.0 in | Wt 193.6 lb

## 2013-05-16 DIAGNOSIS — C4330 Malignant melanoma of unspecified part of face: Secondary | ICD-10-CM

## 2013-05-16 NOTE — Progress Notes (Signed)
Andre Pollard has had 6 fractions to his right cheek.  He is having pain in his right cheek and headaches that he is rating at a 2/10.  He is taking aleve 1 in the morning and 1 at night.  He denies fatigue.  The tumor on his right cheek is bleeding.  He has been using a product on his face that it is used to stop shaving nicks from bleeding.  He is also using biafine.  He reports that his right eye is watering.   The skin on his right cheek and underneath his right eye is red.  He denies a dry mouth.

## 2013-05-16 NOTE — Progress Notes (Signed)
[facility]     Rexene Edison, M.D. [place], [place] [address]               Blair Promise, M.D., Ph.D. Phone: [phone]      [place] A. Tammi Klippel, M.D. Fax: [phone]      Jodelle Gross, M.D., Ph.D.         Thea Silversmith, M.D.         Wyvonnia Lora, M.D Weekly Treatment Management Note  Name: Andre Pollard     MRN: [id number]        CSN: [id number] Date: 05/16/2013      DOB: 1929/08/22  CC: [name], MD         Luan Pulling    Status: Outpatient  Diagnosis: The encounter diagnosis was Malignant melanoma of skin of face.  Current Dose: 18 Gy  Current Fraction: 6  Planned Dose: 30 Gy  Narrative: Andre Pollard was seen today for weekly treatment management. The chart was checked and electron set-up  were reviewed. He has noticed occasional episodes of mild bleeding from the larger tumor along the right face. This stopped promptly with gentle pressure.    Morphine and Morphine and related Current Outpatient Prescriptions  Medication Sig Dispense Refill  . aspirin 81 MG tablet Take 81 mg by mouth daily.        Marland Kitchen donepezil (ARICEPT) 10 MG tablet Take 10 mg by mouth every evening.       Marland Kitchen emollient (BIAFINE) cream Apply topically 2 (two) times daily.      . metoprolol tartrate (LOPRESSOR) 25 MG tablet Take 25 mg by mouth daily.       . Naproxen Sodium (ALEVE PO) Take by mouth as needed.      . NON FORMULARY Take 5 mLs by mouth daily. Extra virgin oil      . sertraline (ZOLOFT) 50 MG tablet Take 50 mg by mouth every evening.       . simvastatin (ZOCOR) 40 MG tablet Take 40 mg by mouth at bedtime.       . Tamsulosin HCl (FLOMAX) 0.4 MG CAPS Take 0.4 mg by mouth Daily.       No current facility-administered medications for this encounter.   Facility-Administered Medications Ordered in Other Encounters  Medication Dose Route Frequency Provider Last Rate Last Dose  . topical emolient (BIAFINE) emulsion    Topical BID Blair Promise, MD       Physical Examination:  Filed Vitals:   05/16/13 1724  BP: 161/61  Pulse: 55  Temp: 97.3 F (36.3 C)    Wt Readings from Last 3 Encounters:  05/16/13 193 lb 9.6 oz (87.816 kg)  05/09/13 191 lb 11.2 oz (86.955 kg)  05/03/13 190 lb 12.8 oz (86.546 kg)   The right cheek area shows diffuse erythema. The tumors nodules have not decreased in size at this point seem to be softer on exam.  Assessment:  Patient tolerating treatments well  Plan: Continue treatment per original radiation prescription.

## 2013-05-17 ENCOUNTER — Ambulatory Visit
Admission: RE | Admit: 2013-05-17 | Discharge: 2013-05-17 | Disposition: A | Discharge status: Home / Self Care | Payer: Medicare Other | Source: Ambulatory Visit | Attending: Radiation Oncology | Admitting: Radiation Oncology

## 2013-05-18 ENCOUNTER — Ambulatory Visit
Admission: RE | Admit: 2013-05-18 | Discharge: 2013-05-18 | Disposition: A | Discharge status: Home / Self Care | Payer: Medicare Other | Source: Ambulatory Visit | Attending: Radiation Oncology | Admitting: Radiation Oncology

## 2013-05-19 ENCOUNTER — Ambulatory Visit
Admission: RE | Admit: 2013-05-19 | Discharge: 2013-05-19 | Disposition: A | Discharge status: Home / Self Care | Payer: Medicare Other | Source: Ambulatory Visit | Attending: Radiation Oncology | Admitting: Radiation Oncology

## 2013-05-22 ENCOUNTER — Ambulatory Visit
Admission: RE | Admit: 2013-05-22 | Discharge: 2013-05-22 | Disposition: A | Discharge status: Home / Self Care | Payer: Medicare Other | Source: Ambulatory Visit | Attending: Radiation Oncology | Admitting: Radiation Oncology

## 2013-05-22 ENCOUNTER — Encounter: Payer: Self-pay | Admitting: Radiation Oncology

## 2013-05-22 VITALS — BP 137/76 | HR 67 | Temp 97.6°F | Ht 70.0 in | Wt 192.3 lb

## 2013-05-22 DIAGNOSIS — C4330 Malignant melanoma of unspecified part of face: Secondary | ICD-10-CM

## 2013-05-22 NOTE — Progress Notes (Signed)
  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   He did have some episodes of bleeding from the site which was controlled well with pressure dressings.  Plan: The patient has completed radiation treatment. The patient will return to radiation oncology clinic for routine followup in one month. I advised them to call or return sooner if they have any questions or concerns related to their recovery or treatment.  -----------------------------------  Blair Promise, PhD, MD

## 2013-05-22 NOTE — Progress Notes (Addendum)
Andre Pollard has had 10 fractions to his right cheek.  He reports having headaches for about 3 hours after treatments.  He also has occasional sharp pains in his right cheek.  He reports a dry mouth at night.  He also reports his right eye is scratchy and waters.  He reports that the tumor on his right cheek was bleeding off and on when he turned his head and stretched the skin.  The skin on his right cheek is pink.  The tumor is scabbed.  He is using biafine cream twice a day.  He denies fatigue.  Follow up appointment card given.

## 2013-05-22 NOTE — Progress Notes (Signed)
[facility]     Rexene Edison, M.D. [place], [place] [address]               Blair Promise, M.D., Ph.D. Phone: [phone]      [place] A. Tammi Klippel, M.D. Fax: [phone]      Jodelle Gross, M.D., Ph.D.         Thea Silversmith, M.D.         Wyvonnia Lora, M.D Weekly Treatment Management Note  Name: Andre Pollard     MRN: [id number]        CSN: [id number] Date: 05/22/2013      DOB: [birth year]  CC: [name], MD         Luan Pulling    Status: Outpatient  Diagnosis: The encounter diagnosis was Malignant melanoma of skin of face.  Current Dose: 30 Gy  Current Fraction: 10  Planned Dose: 30 Gy  Narrative: [name] was seen today for weekly treatment management. The chart was checked and electron setup   were reviewed.   Morphine and Morphine and related Current Outpatient Prescriptions  Medication Sig Dispense Refill  . aspirin 81 MG tablet Take 81 mg by mouth daily.        Marland Kitchen donepezil (ARICEPT) 10 MG tablet Take 10 mg by mouth every evening.       Marland Kitchen emollient (BIAFINE) cream Apply topically 2 (two) times daily.      . metoprolol tartrate (LOPRESSOR) 25 MG tablet Take 25 mg by mouth daily.       . Naproxen Sodium (ALEVE PO) Take by mouth as needed.      . NON FORMULARY Take 5 mLs by mouth daily. Extra virgin oil      . sertraline (ZOLOFT) 50 MG tablet Take 50 mg by mouth every evening.       . simvastatin (ZOCOR) 40 MG tablet Take 40 mg by mouth at bedtime.       . Tamsulosin HCl (FLOMAX) 0.4 MG CAPS Take 0.4 mg by mouth Daily.       No current facility-administered medications for this encounter.   Facility-Administered Medications Ordered in Other Encounters  Medication Dose Route Frequency Provider Last Rate Last Dose  . topical emolient (BIAFINE) emulsion   Topical BID Blair Promise, MD       Labs:   Physical Examination:  Filed Vitals:   05/22/13 1553  BP: 137/76  Pulse: 67  Temp: 97.6 F  (36.4 C)    Wt Readings from Last 3 Encounters:  05/22/13 192 lb 4.8 oz (87.227 kg)  05/16/13 193 lb 9.6 oz (87.816 kg)  05/09/13 191 lb 11.2 oz (86.955 kg)   The right face area shows erythema from the radiation fields. The tumor is mass is softer with palpation and seems to be regressing at this time no bleeding is noted or drainage.   Assessment:  Patient tolerated treatments well  Plan: Routine followup in one month.

## 2013-06-21 ENCOUNTER — Encounter: Payer: Self-pay | Admitting: Oncology

## 2013-06-22 ENCOUNTER — Ambulatory Visit
Admission: RE | Admit: 2013-06-22 | Discharge: 2013-06-22 | Disposition: A | Discharge status: Home / Self Care | Payer: Medicare Other | Source: Ambulatory Visit | Attending: Radiation Oncology | Admitting: Radiation Oncology

## 2013-06-22 ENCOUNTER — Encounter: Payer: Self-pay | Admitting: Radiation Oncology

## 2013-06-22 VITALS — BP 153/62 | HR 52 | Temp 97.7°F | Ht 70.0 in | Wt 191.9 lb

## 2013-06-22 DIAGNOSIS — C4330 Malignant melanoma of unspecified part of face: Secondary | ICD-10-CM

## 2013-06-22 NOTE — Progress Notes (Signed)
Andre Pollard here for follow up after treatment to his right face.  He is having new midsternal pain today that he rates at a 2/10.  It started this morning.  He says it does not radiate to either side .  He loaded 2 truck loads of wood two days ago.  He reports a dry mouth at night occasionally.  He reports the bleeding stoped in his right cheek about a week after treatment started.  He denies trouble swallowing and fatigue.  He denies hearing loss and ringing in his right ear.  His oral mucosa is intact.  The lesion on his right cheek has gone down and is scabbed.  He is having surgery tomorrow by Dr. Freddi Che.

## 2013-06-22 NOTE — Progress Notes (Signed)
  Radiation Oncology         [phone]) [phone] ________________________________  Name: Andre Pollard MRN: [id number]  Date: 06/22/2013  DOB: 1929/07/25  Follow-Up Visit Note  CC: Andre Bogus, MD  Andre Devon, MD  Diagnosis:   Recurrent melanoma of the skin of the right face    Interval Since Last Radiation:  1  months  Narrative:  The patient returns today for routine follow-up.  He is doing well at this time. He denies any itching or soreness along the right face. He denies any bleeding or drainage from the area. During the patient's treatment his tumor did not regress well, but according to family members over the past 2 weeks the tumor has regressed nicely.   He denies any fatigue at this time.                           ALLERGIES:  is allergic to morphine and morphine and related.  Meds: Current Outpatient Prescriptions  Medication Sig Dispense Refill  . donepezil (ARICEPT) 10 MG tablet Take 10 mg by mouth every evening.       Marland Kitchen emollient (BIAFINE) cream Apply topically 2 (two) times daily.      . metoprolol tartrate (LOPRESSOR) 25 MG tablet Take 25 mg by mouth daily.       . Naproxen Sodium (ALEVE PO) Take by mouth as needed.      . sertraline (ZOLOFT) 50 MG tablet Take 50 mg by mouth every evening.       . simvastatin (ZOCOR) 40 MG tablet Take 40 mg by mouth at bedtime.       . Tamsulosin HCl (FLOMAX) 0.4 MG CAPS Take 0.4 mg by mouth Daily.      Marland Kitchen aspirin 81 MG tablet Take 81 mg by mouth daily.        . NON FORMULARY Take 5 mLs by mouth daily. Extra virgin oil       No current facility-administered medications for this encounter.   Facility-Administered Medications Ordered in Other Encounters  Medication Dose Route Frequency Provider Last Rate Last Dose  . topical emolient (BIAFINE) emulsion   Topical BID Blair Promise, MD        Physical Findings: The patient is in no acute distress. Patient is alert and oriented.  height is 5\' 10"  (1.778 m) and weight is 191 lb  14.4 oz (87.045 kg). His temperature is 97.7 F (36.5 C). His blood pressure is 153/62 and his pulse is 52. His oxygen saturation is 98%. . The tumor is nodule along the right cheek area has regressed nicely. There is no drainage or signs of bleeding.  Lab Findings: Lab Results  Component Value Date   WBC 6.5 03/03/2013   HGB 14.1 03/03/2013   HCT 42.3 03/03/2013   MCV 89.8 03/03/2013   PLT 193 03/03/2013      Radiographic Findings: No results found.  Impression:  Favorable response to preoperative radiation therapy for recurrent malignant melanoma  Plan:  Patient is scheduled for surgery tomorrow at [facility].. the patient may wish to followup with myself in [place] after his surgery but will call to arrange this if needed.  ____________________________________ Blair Promise, MD

## 2013-06-30 ENCOUNTER — Ambulatory Visit (INDEPENDENT_AMBULATORY_CARE_PROVIDER_SITE_OTHER): Payer: Medicare Other | Admitting: Urology

## 2013-06-30 DIAGNOSIS — N138 Other obstructive and reflux uropathy: Secondary | ICD-10-CM

## 2013-06-30 DIAGNOSIS — R338 Other retention of urine: Secondary | ICD-10-CM

## 2013-06-30 DIAGNOSIS — N401 Enlarged prostate with lower urinary tract symptoms: Secondary | ICD-10-CM

## 2013-07-07 ENCOUNTER — Encounter (INDEPENDENT_AMBULATORY_CARE_PROVIDER_SITE_OTHER): Payer: Self-pay

## 2013-07-07 ENCOUNTER — Ambulatory Visit (INDEPENDENT_AMBULATORY_CARE_PROVIDER_SITE_OTHER): Payer: Medicare Other | Admitting: Neurology

## 2013-07-07 ENCOUNTER — Encounter: Payer: Self-pay | Admitting: Neurology

## 2013-07-07 VITALS — BP 124/66 | HR 61 | Ht 71.0 in | Wt 193.0 lb

## 2013-07-07 DIAGNOSIS — R269 Unspecified abnormalities of gait and mobility: Secondary | ICD-10-CM

## 2013-07-07 DIAGNOSIS — C439 Malignant melanoma of skin, unspecified: Secondary | ICD-10-CM
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
## 2013-08-18 ENCOUNTER — Ambulatory Visit (INDEPENDENT_AMBULATORY_CARE_PROVIDER_SITE_OTHER): Payer: Medicare Other | Admitting: Urology

## 2013-08-18 DIAGNOSIS — N138 Other obstructive and reflux uropathy: Secondary | ICD-10-CM

## 2013-08-18 DIAGNOSIS — R972 Elevated prostate specific antigen [PSA]: Secondary | ICD-10-CM

## 2013-08-18 DIAGNOSIS — N401 Enlarged prostate with lower urinary tract symptoms: Secondary | ICD-10-CM

## 2013-09-14 ENCOUNTER — Other Ambulatory Visit (HOSPITAL_COMMUNITY): Payer: Self-pay | Admitting: Orthopaedic Surgery

## 2013-09-14 DIAGNOSIS — M545 Low back pain, unspecified: Secondary | ICD-10-CM

## 2013-09-14 DIAGNOSIS — M533 Sacrococcygeal disorders, not elsewhere classified: Secondary | ICD-10-CM

## 2013-09-14 DIAGNOSIS — G8929 Other chronic pain: Secondary | ICD-10-CM

## 2013-09-18 ENCOUNTER — Encounter (HOSPITAL_COMMUNITY): Payer: Medicare Other | Attending: Hematology and Oncology

## 2013-09-18 DIAGNOSIS — C4330 Malignant melanoma of unspecified part of face: Secondary | ICD-10-CM | POA: Insufficient documentation

## 2013-09-18 DIAGNOSIS — D0460 Carcinoma in situ of skin of unspecified upper limb, including shoulder: Secondary | ICD-10-CM | POA: Insufficient documentation

## 2013-09-18 DIAGNOSIS — Z9049 Acquired absence of other specified parts of digestive tract: Secondary | ICD-10-CM | POA: Insufficient documentation

## 2013-09-18 DIAGNOSIS — N320 Bladder-neck obstruction: Secondary | ICD-10-CM | POA: Insufficient documentation

## 2013-09-18 DIAGNOSIS — G9389 Other specified disorders of brain: Secondary | ICD-10-CM | POA: Insufficient documentation

## 2013-09-18 DIAGNOSIS — M48061 Spinal stenosis, lumbar region without neurogenic claudication: Secondary | ICD-10-CM | POA: Insufficient documentation

## 2013-09-18 DIAGNOSIS — Z85038 Personal history of other malignant neoplasm of large intestine: Secondary | ICD-10-CM | POA: Insufficient documentation

## 2013-09-18 DIAGNOSIS — G5600 Carpal tunnel syndrome, unspecified upper limb: Secondary | ICD-10-CM | POA: Insufficient documentation

## 2013-09-18 DIAGNOSIS — Z87891 Personal history of nicotine dependence: Secondary | ICD-10-CM | POA: Insufficient documentation

## 2013-09-18 DIAGNOSIS — I10 Essential (primary) hypertension: Secondary | ICD-10-CM | POA: Insufficient documentation

## 2013-09-18 DIAGNOSIS — C439 Malignant melanoma of skin, unspecified: Secondary | ICD-10-CM

## 2013-09-18 DIAGNOSIS — C189 Malignant neoplasm of colon, unspecified: Secondary | ICD-10-CM

## 2013-09-18 DIAGNOSIS — Z808 Family history of malignant neoplasm of other organs or systems: Secondary | ICD-10-CM | POA: Insufficient documentation

## 2013-09-18 LAB — CBC WITH DIFFERENTIAL/PLATELET
BASOS ABS: 0 10*3/uL (ref 0.0–0.1)
Basophils Relative: 0 % (ref 0–1)
Eosinophils Absolute: 0 10*3/uL (ref 0.0–0.7)
Eosinophils Relative: 0 % (ref 0–5)
HEMATOCRIT: 42.1 % (ref 39.0–52.0)
HEMOGLOBIN: 14.1 g/dL (ref 13.0–17.0)
LYMPHS ABS: 1.3 10*3/uL (ref 0.7–4.0)
LYMPHS PCT: 12 % (ref 12–46)
MCH: 30.2 pg (ref 26.0–34.0)
MCHC: 33.5 g/dL (ref 30.0–36.0)
MCV: 90.1 fL (ref 78.0–100.0)
MONO ABS: 0.8 10*3/uL (ref 0.1–1.0)
Monocytes Relative: 7 % (ref 3–12)
NEUTROS ABS: 8.6 10*3/uL — AB (ref 1.7–7.7)
Neutrophils Relative %: 81 % — ABNORMAL HIGH (ref 43–77)
Platelets: 203 10*3/uL (ref 150–400)
RBC: 4.67 MIL/uL (ref 4.22–5.81)
RDW: 13.3 % (ref 11.5–15.5)
WBC: 10.7 10*3/uL — AB (ref 4.0–10.5)

## 2013-09-18 LAB — COMPREHENSIVE METABOLIC PANEL
ALBUMIN: 3.5 g/dL (ref 3.5–5.2)
ALK PHOS: 79 U/L (ref 39–117)
ALT: 18 U/L (ref 0–53)
AST: 17 U/L (ref 0–37)
BUN: 16 mg/dL (ref 6–23)
CALCIUM: 9 mg/dL (ref 8.4–10.5)
CHLORIDE: 104 meq/L (ref 96–112)
CO2: 27 meq/L (ref 19–32)
Creatinine, Ser: 0.83 mg/dL (ref 0.50–1.35)
GFR calc non Af Amer: 80 mL/min — ABNORMAL LOW (ref >90)
Glucose, Bld: 110 mg/dL — ABNORMAL HIGH (ref 70–99)
Potassium: 4.3 mEq/L (ref 3.7–5.3)
Sodium: 141 mEq/L (ref 137–147)
TOTAL PROTEIN: 7.1 g/dL (ref 6.0–8.3)
Total Bilirubin: 0.4 mg/dL (ref 0.3–1.2)

## 2013-09-18 LAB — LACTATE DEHYDROGENASE: LDH: 151 U/L (ref 94–250)

## 2013-09-18 NOTE — Progress Notes (Signed)
Labs drawn

## 2013-09-19 ENCOUNTER — Ambulatory Visit (HOSPITAL_COMMUNITY)
Admission: RE | Admit: 2013-09-19 | Discharge: 2013-09-19 | Disposition: A | Discharge status: Home / Self Care | Payer: Medicare Other | Source: Ambulatory Visit | Attending: Orthopaedic Surgery | Admitting: Orthopaedic Surgery

## 2013-09-19 DIAGNOSIS — M545 Low back pain, unspecified: Secondary | ICD-10-CM

## 2013-09-19 DIAGNOSIS — M533 Sacrococcygeal disorders, not elsewhere classified: Secondary | ICD-10-CM

## 2013-09-19 DIAGNOSIS — M48061 Spinal stenosis, lumbar region without neurogenic claudication: Secondary | ICD-10-CM | POA: Insufficient documentation

## 2013-09-19 DIAGNOSIS — G8929 Other chronic pain: Secondary | ICD-10-CM

## 2013-09-19 DIAGNOSIS — Z85038 Personal history of other malignant neoplasm of large intestine: Secondary | ICD-10-CM | POA: Insufficient documentation

## 2013-09-19 DIAGNOSIS — M47817 Spondylosis without myelopathy or radiculopathy, lumbosacral region: Secondary | ICD-10-CM | POA: Insufficient documentation

## 2013-09-19 DIAGNOSIS — M5126 Other intervertebral disc displacement, lumbar region: Secondary | ICD-10-CM | POA: Insufficient documentation

## 2013-09-19 DIAGNOSIS — Z85820 Personal history of malignant melanoma of skin: Secondary | ICD-10-CM | POA: Insufficient documentation

## 2013-09-19 LAB — CEA: CEA: 1.9 ng/mL (ref 0.0–5.0)

## 2013-09-19 MED ORDER — GADOBENATE DIMEGLUMINE 529 MG/ML IV SOLN
18.0000 mL | Freq: Once | INTRAVENOUS | Status: AC | PRN
  Administered 2013-09-19: 18 mL via INTRAVENOUS

## 2013-09-20 ENCOUNTER — Encounter (HOSPITAL_BASED_OUTPATIENT_CLINIC_OR_DEPARTMENT_OTHER): Payer: Medicare Other

## 2013-09-20 ENCOUNTER — Encounter (HOSPITAL_COMMUNITY): Payer: Self-pay

## 2013-09-20 VITALS — BP 166/74 | HR 68 | Temp 97.8°F | Resp 18 | Wt 192.5 lb

## 2013-09-20 DIAGNOSIS — C189 Malignant neoplasm of colon, unspecified: Secondary | ICD-10-CM

## 2013-09-20 DIAGNOSIS — C439 Malignant melanoma of skin, unspecified: Secondary | ICD-10-CM
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
## 2013-09-20 NOTE — Patient Instructions (Signed)
[facility] Discharge Instructions  RECOMMENDATIONS MADE BY THE CONSULTANT AND ANY TEST RESULTS WILL BE SENT TO YOUR REFERRING PHYSICIAN.   We will see you in 6 months for a doctor's appointment and repeat lab work. Please call us for any questions or concerns.   Thank you for choosing [facility] to provide your oncology and hematology care.  To afford each patient quality time with our providers, please arrive at least 15 minutes before your scheduled appointment time.  With your help, our goal is to use those 15 minutes to complete the necessary work-up to ensure our physicians have the information they need to help with your evaluation and healthcare recommendations.    Effective January 1st, 2014, we ask that you re-schedule your appointment with our physicians should you arrive 10 or more minutes late for your appointment.  We strive to give you quality time with our providers, and arriving late affects you and other patients whose appointments are after yours.    Again, thank you for choosing [facility].  Our hope is that these requests will decrease the amount of time that you wait before being seen by our physicians.       _____________________________________________________________  Should you have questions after your visit to [facility], please contact our office at [phone] between the hours of 8:30 a.m. and 5:00 p.m.  Voicemails left after 4:30 p.m. will not be returned until the following business day.  For prescription refill requests, have your pharmacy contact our office with your prescription refill request.

## 2013-10-26 ENCOUNTER — Other Ambulatory Visit: Payer: Self-pay | Admitting: Neurosurgery

## 2013-10-27 ENCOUNTER — Encounter (HOSPITAL_COMMUNITY): Payer: Self-pay | Admitting: Pharmacy Technician

## 2013-10-30 ENCOUNTER — Encounter (HOSPITAL_COMMUNITY): Payer: Self-pay | Admitting: *Deleted

## 2013-10-30 ENCOUNTER — Inpatient Hospital Stay (HOSPITAL_COMMUNITY): Admission: RE | Admit: 2013-10-30 | Payer: Medicare Other | Source: Ambulatory Visit

## 2013-10-30 MED ORDER — CEFAZOLIN SODIUM-DEXTROSE 2-3 GM-% IV SOLR
2.0000 g | INTRAVENOUS | Status: AC
  Administered 2013-10-31: 2 g via INTRAVENOUS
  Filled 2013-10-30: qty 50
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
## 2013-10-30 NOTE — Progress Notes (Signed)
10/30/13 0954  OBSTRUCTIVE SLEEP APNEA  Have you ever been diagnosed with sleep apnea through a sleep study? No  Do you snore loudly (loud enough to be heard through closed doors)?  0  Do you often feel tired, fatigued, or sleepy during the daytime? 1  Has anyone observed you stop breathing during your sleep? 0  Do you have, or are you being treated for high blood pressure? 1  BMI more than 35 kg/m2? 0  Age over 78 years old? 1  Neck circumference greater than 40 cm/16 inches? 1 (17.5)  Gender: 1  Obstructive Sleep Apnea Score 5  Score 4 or greater  Results sent to PCP
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
## 2013-10-31 ENCOUNTER — Inpatient Hospital Stay (HOSPITAL_COMMUNITY)
Admission: RE | Admit: 2013-10-31 | Discharge: 2013-11-03 | DRG: 520 | Disposition: A | Discharge status: Home / Self Care | Payer: Medicare Other | Source: Ambulatory Visit | Attending: Neurosurgery | Admitting: Neurosurgery

## 2013-10-31 ENCOUNTER — Inpatient Hospital Stay (HOSPITAL_COMMUNITY): Payer: Medicare Other | Admitting: Certified Registered Nurse Anesthetist

## 2013-10-31 ENCOUNTER — Encounter (HOSPITAL_COMMUNITY): Payer: Self-pay | Admitting: *Deleted

## 2013-10-31 ENCOUNTER — Encounter (HOSPITAL_COMMUNITY)
Admission: RE | Disposition: A | Discharge status: Home / Self Care | Payer: Medicare Other | Source: Ambulatory Visit | Attending: Neurosurgery

## 2013-10-31 ENCOUNTER — Encounter (HOSPITAL_COMMUNITY): Payer: Medicare Other | Admitting: Certified Registered Nurse Anesthetist

## 2013-10-31 ENCOUNTER — Inpatient Hospital Stay (HOSPITAL_COMMUNITY): Payer: Medicare Other

## 2013-10-31 DIAGNOSIS — F329 Major depressive disorder, single episode, unspecified: Secondary | ICD-10-CM | POA: Diagnosis present

## 2013-10-31 DIAGNOSIS — Z85038 Personal history of other malignant neoplasm of large intestine: Secondary | ICD-10-CM

## 2013-10-31 DIAGNOSIS — G839 Paralytic syndrome, unspecified: Secondary | ICD-10-CM | POA: Diagnosis not present

## 2013-10-31 DIAGNOSIS — I10 Essential (primary) hypertension: Secondary | ICD-10-CM | POA: Diagnosis not present

## 2013-10-31 DIAGNOSIS — M48062 Spinal stenosis, lumbar region with neurogenic claudication: Secondary | ICD-10-CM | POA: Diagnosis not present

## 2013-10-31 DIAGNOSIS — F3289 Other specified depressive episodes: Secondary | ICD-10-CM | POA: Diagnosis present

## 2013-10-31 DIAGNOSIS — E7800 Pure hypercholesterolemia, unspecified: Secondary | ICD-10-CM | POA: Diagnosis present

## 2013-10-31 DIAGNOSIS — M545 Low back pain, unspecified: Secondary | ICD-10-CM | POA: Diagnosis present

## 2013-10-31 DIAGNOSIS — M129 Arthropathy, unspecified: Secondary | ICD-10-CM | POA: Diagnosis present

## 2013-10-31 DIAGNOSIS — H9190 Unspecified hearing loss, unspecified ear: Secondary | ICD-10-CM | POA: Diagnosis present

## 2013-10-31 DIAGNOSIS — G930 Cerebral cysts: Secondary | ICD-10-CM | POA: Diagnosis present

## 2013-10-31 DIAGNOSIS — Z85820 Personal history of malignant melanoma of skin: Secondary | ICD-10-CM | POA: Diagnosis not present

## 2013-10-31 DIAGNOSIS — Z923 Personal history of irradiation: Secondary | ICD-10-CM

## 2013-10-31 DIAGNOSIS — Z87891 Personal history of nicotine dependence: Secondary | ICD-10-CM

## 2013-10-31 HISTORY — DX: Full incontinence of feces: R15.9

## 2013-10-31 HISTORY — DX: Other complications of anesthesia, initial encounter: T88.59XA

## 2013-10-31 HISTORY — DX: Adverse effect of unspecified anesthetic, initial encounter: T41.45XA

## 2013-10-31 HISTORY — DX: Hyperesthesia: R20.3

## 2013-10-31 HISTORY — DX: Major depressive disorder, single episode, unspecified: F32.9

## 2013-10-31 HISTORY — DX: Myoneural disorder, unspecified: G70.9

## 2013-10-31 HISTORY — DX: Unspecified osteoarthritis, unspecified site: M19.90

## 2013-10-31 HISTORY — DX: Personal history of other medical treatment: Z92.89

## 2013-10-31 HISTORY — PX: LUMBAR LAMINECTOMY/DECOMPRESSION MICRODISCECTOMY: SHX5026

## 2013-10-31 HISTORY — DX: Unspecified hearing loss, unspecified ear: H91.90

## 2013-10-31 HISTORY — DX: Depression, unspecified: F32.A

## 2013-10-31 HISTORY — DX: Calculus of kidney: N20.0

## 2013-10-31 LAB — CBC
HEMATOCRIT: 43.4 % (ref 39.0–52.0)
HEMOGLOBIN: 14.4 g/dL (ref 13.0–17.0)
MCH: 30.6 pg (ref 26.0–34.0)
MCHC: 33.2 g/dL (ref 30.0–36.0)
MCV: 92.3 fL (ref 78.0–100.0)
Platelets: 158 10*3/uL (ref 150–400)
RBC: 4.7 MIL/uL (ref 4.22–5.81)
RDW: 13 % (ref 11.5–15.5)
WBC: 5.8 10*3/uL (ref 4.0–10.5)

## 2013-10-31 LAB — BASIC METABOLIC PANEL
Anion gap: 10 (ref 5–15)
BUN: 10 mg/dL (ref 6–23)
CHLORIDE: 106 meq/L (ref 96–112)
CO2: 28 mEq/L (ref 19–32)
CREATININE: 0.94 mg/dL (ref 0.50–1.35)
Calcium: 9 mg/dL (ref 8.4–10.5)
GFR calc non Af Amer: 76 mL/min — ABNORMAL LOW (ref >90)
GFR, EST AFRICAN AMERICAN: 88 mL/min — AB (ref >90)
GLUCOSE: 108 mg/dL — AB (ref 70–99)
POTASSIUM: 4.8 meq/L (ref 3.7–5.3)
Sodium: 144 mEq/L (ref 137–147)

## 2013-10-31 LAB — SURGICAL PCR SCREEN
MRSA, PCR: NEGATIVE
Staphylococcus aureus: NEGATIVE

## 2013-10-31 SURGERY — LUMBAR LAMINECTOMY/DECOMPRESSION MICRODISCECTOMY 1 LEVEL
Anesthesia: General | Site: Back | Laterality: Bilateral

## 2013-10-31 MED ORDER — ONDANSETRON HCL 4 MG/2ML IJ SOLN
INTRAMUSCULAR | Status: AC
  Filled 2013-10-31: qty 2

## 2013-10-31 MED ORDER — METOPROLOL TARTRATE 25 MG PO TABS
25.0000 mg | ORAL_TABLET | Freq: Every day | ORAL | Status: DC
  Administered 2013-11-01 – 2013-11-03 (×3): 25 mg via ORAL
  Filled 2013-10-31 (×3): qty 1

## 2013-10-31 MED ORDER — ACETAMINOPHEN 650 MG RE SUPP: 650.0000 mg | RECTAL | Status: DC | PRN

## 2013-10-31 MED ORDER — SERTRALINE HCL 50 MG PO TABS
50.0000 mg | ORAL_TABLET | Freq: Every day | ORAL | Status: DC
  Administered 2013-10-31 – 2013-11-02 (×3): 50 mg via ORAL
  Filled 2013-10-31 (×3): qty 1

## 2013-10-31 MED ORDER — EPHEDRINE SULFATE 50 MG/ML IJ SOLN
INTRAMUSCULAR | Status: DC | PRN
  Administered 2013-10-31 (×2): 10 mg via INTRAVENOUS

## 2013-10-31 MED ORDER — FENTANYL CITRATE 0.05 MG/ML IJ SOLN
INTRAMUSCULAR | Status: AC
  Filled 2013-10-31: qty 5

## 2013-10-31 MED ORDER — EPHEDRINE SULFATE 50 MG/ML IJ SOLN
INTRAMUSCULAR | Status: AC
  Filled 2013-10-31: qty 1

## 2013-10-31 MED ORDER — MENTHOL 3 MG MT LOZG: 1.0000 | LOZENGE | OROMUCOSAL | Status: DC | PRN

## 2013-10-31 MED ORDER — FENTANYL CITRATE 0.05 MG/ML IJ SOLN
INTRAMUSCULAR | Status: DC | PRN
  Administered 2013-10-31: 50 ug via INTRAVENOUS
  Administered 2013-10-31: 100 ug via INTRAVENOUS

## 2013-10-31 MED ORDER — SODIUM CHLORIDE 0.9 % IV SOLN
INTRAVENOUS | Status: DC
  Administered 2013-10-31: 1000 mL via INTRAVENOUS

## 2013-10-31 MED ORDER — ROCURONIUM BROMIDE 100 MG/10ML IV SOLN
INTRAVENOUS | Status: DC | PRN
  Administered 2013-10-31 (×2): 10 mg via INTRAVENOUS
  Administered 2013-10-31: 50 mg via INTRAVENOUS

## 2013-10-31 MED ORDER — ROCURONIUM BROMIDE 50 MG/5ML IV SOLN
INTRAVENOUS | Status: AC
  Filled 2013-10-31: qty 1

## 2013-10-31 MED ORDER — SODIUM CHLORIDE 0.9 % IJ SOLN
3.0000 mL | Freq: Two times a day (BID) | INTRAMUSCULAR | Status: DC
  Administered 2013-11-01 – 2013-11-02 (×3): 3 mL via INTRAVENOUS

## 2013-10-31 MED ORDER — SODIUM CHLORIDE 0.9 % IJ SOLN
INTRAMUSCULAR | Status: AC
  Filled 2013-10-31: qty 10

## 2013-10-31 MED ORDER — GLYCOPYRROLATE 0.2 MG/ML IJ SOLN
INTRAMUSCULAR | Status: AC
  Filled 2013-10-31: qty 4

## 2013-10-31 MED ORDER — TAMSULOSIN HCL 0.4 MG PO CAPS
0.8000 mg | ORAL_CAPSULE | Freq: Every day | ORAL | Status: DC
  Administered 2013-11-01 – 2013-11-03 (×3): 0.8 mg via ORAL
  Filled 2013-10-31 (×3): qty 2

## 2013-10-31 MED ORDER — FENTANYL CITRATE 0.05 MG/ML IJ SOLN
INTRAMUSCULAR | Status: AC
  Filled 2013-10-31: qty 2

## 2013-10-31 MED ORDER — MUPIROCIN 2 % EX OINT
TOPICAL_OINTMENT | Freq: Once | CUTANEOUS | Status: AC
  Administered 2013-10-31: 1 via NASAL

## 2013-10-31 MED ORDER — ONDANSETRON HCL 4 MG/2ML IJ SOLN
INTRAMUSCULAR | Status: DC | PRN
  Administered 2013-10-31: 4 mg via INTRAVENOUS

## 2013-10-31 MED ORDER — SIMVASTATIN 40 MG PO TABS
40.0000 mg | ORAL_TABLET | Freq: Every day | ORAL | Status: DC
  Administered 2013-10-31 – 2013-11-02 (×3): 40 mg via ORAL
  Filled 2013-10-31 (×3): qty 1

## 2013-10-31 MED ORDER — FENTANYL CITRATE 0.05 MG/ML IJ SOLN
25.0000 ug | INTRAMUSCULAR | Status: DC | PRN
  Administered 2013-10-31 (×3): 50 ug via INTRAVENOUS

## 2013-10-31 MED ORDER — ACETAMINOPHEN 325 MG PO TABS: 650.0000 mg | ORAL_TABLET | ORAL | Status: DC | PRN

## 2013-10-31 MED ORDER — LIDOCAINE HCL (CARDIAC) 20 MG/ML IV SOLN
INTRAVENOUS | Status: DC | PRN
  Administered 2013-10-31: 100 mg via INTRAVENOUS

## 2013-10-31 MED ORDER — ONDANSETRON HCL 4 MG/2ML IJ SOLN: 4.0000 mg | INTRAMUSCULAR | Status: DC | PRN

## 2013-10-31 MED ORDER — PROPOFOL 10 MG/ML IV BOLUS
INTRAVENOUS | Status: DC | PRN
  Administered 2013-10-31: 130 mg via INTRAVENOUS

## 2013-10-31 MED ORDER — LIDOCAINE HCL (CARDIAC) 20 MG/ML IV SOLN
INTRAVENOUS | Status: AC
Start: 2013-10-31 — End: 2013-10-31
  Filled 2013-10-31: qty 5

## 2013-10-31 MED ORDER — OXYCODONE-ACETAMINOPHEN 5-325 MG PO TABS
1.0000 | ORAL_TABLET | ORAL | Status: DC | PRN
  Administered 2013-10-31 – 2013-11-02 (×2): 2 via ORAL
  Filled 2013-10-31 (×2): qty 2

## 2013-10-31 MED ORDER — LACTATED RINGERS IV SOLN
INTRAVENOUS | Status: DC | PRN
  Administered 2013-10-31 (×2): via INTRAVENOUS

## 2013-10-31 MED ORDER — HEMOSTATIC AGENTS (NO CHARGE) OPTIME
TOPICAL | Status: DC | PRN
  Administered 2013-10-31: 1 via TOPICAL

## 2013-10-31 MED ORDER — CEFAZOLIN SODIUM 1-5 GM-% IV SOLN
1.0000 g | Freq: Three times a day (TID) | INTRAVENOUS | Status: AC
  Administered 2013-10-31 – 2013-11-01 (×2): 1 g via INTRAVENOUS
  Filled 2013-10-31 (×2): qty 50

## 2013-10-31 MED ORDER — ARTIFICIAL TEARS OP OINT
TOPICAL_OINTMENT | OPHTHALMIC | Status: AC
  Filled 2013-10-31: qty 3.5

## 2013-10-31 MED ORDER — GLYCOPYRROLATE 0.2 MG/ML IJ SOLN
INTRAMUSCULAR | Status: DC | PRN
  Administered 2013-10-31: 0.6 mg via INTRAVENOUS
  Administered 2013-10-31: 0.2 mg via INTRAVENOUS

## 2013-10-31 MED ORDER — NEOSTIGMINE METHYLSULFATE 10 MG/10ML IV SOLN
INTRAVENOUS | Status: DC | PRN
  Administered 2013-10-31: 4 mg via INTRAVENOUS

## 2013-10-31 MED ORDER — MIDAZOLAM HCL 5 MG/5ML IJ SOLN: INTRAMUSCULAR | Status: DC | PRN

## 2013-10-31 MED ORDER — PROPOFOL 10 MG/ML IV BOLUS
INTRAVENOUS | Status: AC
  Filled 2013-10-31: qty 20

## 2013-10-31 MED ORDER — LACTATED RINGERS IV SOLN
INTRAVENOUS | Status: DC
  Administered 2013-10-31: 10:00:00 via INTRAVENOUS

## 2013-10-31 MED ORDER — PHENOL 1.4 % MT LIQD: 1.0000 | OROMUCOSAL | Status: DC | PRN

## 2013-10-31 MED ORDER — ASPIRIN EC 81 MG PO TBEC
81.0000 mg | DELAYED_RELEASE_TABLET | Freq: Every day | ORAL | Status: DC
  Administered 2013-11-01 – 2013-11-03 (×3): 81 mg via ORAL
  Filled 2013-10-31 (×3): qty 1

## 2013-10-31 MED ORDER — SODIUM CHLORIDE 0.9 % IR SOLN
Status: DC | PRN
  Administered 2013-10-31: 1000 mL

## 2013-10-31 MED ORDER — NEOSTIGMINE METHYLSULFATE 10 MG/10ML IV SOLN
INTRAVENOUS | Status: AC
  Filled 2013-10-31: qty 1

## 2013-10-31 MED ORDER — SODIUM CHLORIDE 0.9 % IJ SOLN: 3.0000 mL | INTRAMUSCULAR | Status: DC | PRN

## 2013-10-31 MED ORDER — VANCOMYCIN HCL 1000 MG IV SOLR
INTRAVENOUS | Status: DC | PRN
  Administered 2013-10-31: 1000 mg via TOPICAL

## 2013-10-31 MED ORDER — VANCOMYCIN HCL 1000 MG IV SOLR
INTRAVENOUS | Status: AC
  Filled 2013-10-31: qty 1000

## 2013-10-31 MED ORDER — DONEPEZIL HCL 10 MG PO TABS
10.0000 mg | ORAL_TABLET | Freq: Every day | ORAL | Status: DC
  Administered 2013-10-31 – 2013-11-02 (×3): 10 mg via ORAL
  Filled 2013-10-31 (×3): qty 1

## 2013-10-31 MED ORDER — PROMETHAZINE HCL 25 MG/ML IJ SOLN: 6.2500 mg | INTRAMUSCULAR | Status: DC | PRN

## 2013-10-31 MED ORDER — PHENYLEPHRINE HCL 10 MG/ML IJ SOLN
10.0000 mg | INTRAVENOUS | Status: DC | PRN
  Administered 2013-10-31: 25 ug/min via INTRAVENOUS

## 2013-10-31 MED ORDER — DIAZEPAM 5 MG PO TABS: 5.0000 mg | ORAL_TABLET | Freq: Four times a day (QID) | ORAL | Status: DC | PRN

## 2013-10-31 MED ORDER — MUPIROCIN 2 % EX OINT
TOPICAL_OINTMENT | CUTANEOUS | Status: AC
  Administered 2013-10-31: 1 via NASAL
  Filled 2013-10-31: qty 22

## 2013-10-31 MED ORDER — HYDROMORPHONE HCL PF 1 MG/ML IJ SOLN: 0.5000 mg | INTRAMUSCULAR | Status: DC | PRN

## 2013-10-31 SURGICAL SUPPLY — 61 items
BENZOIN TINCTURE PRP APPL 2/3 (GAUZE/BANDAGES/DRESSINGS) ×3 IMPLANT
BLADE 10 SAFETY STRL DISP (BLADE) ×3 IMPLANT
BLADE SURG ROTATE 9660 (MISCELLANEOUS) IMPLANT
BUR ACORN 6.0 (BURR) ×2 IMPLANT
BUR ACORN 6.0MM (BURR) ×1
BUR MATCHSTICK NEURO 3.0 LAGG (BURR) IMPLANT
CANISTER SUCT 3000ML (MISCELLANEOUS) ×3 IMPLANT
CLOSURE WOUND 1/2 X4 (GAUZE/BANDAGES/DRESSINGS) ×1
CONT SPEC 4OZ CLIKSEAL STRL BL (MISCELLANEOUS) ×6 IMPLANT
DRAPE LAPAROTOMY 100X72X124 (DRAPES) ×3 IMPLANT
DRAPE MICROSCOPE LEICA (MISCELLANEOUS) ×3 IMPLANT
DRAPE POUCH INSTRU U-SHP 10X18 (DRAPES) ×3 IMPLANT
DURAPREP 26ML APPLICATOR (WOUND CARE) ×3 IMPLANT
ELECT BLADE 4.0 EZ CLEAN MEGAD (MISCELLANEOUS) ×3
ELECT REM PT RETURN 9FT ADLT (ELECTROSURGICAL) ×3
ELECTRODE BLDE 4.0 EZ CLN MEGD (MISCELLANEOUS) ×1 IMPLANT
ELECTRODE REM PT RTRN 9FT ADLT (ELECTROSURGICAL) ×1 IMPLANT
EVACUATOR 3/16  PVC DRAIN (DRAIN) ×4
EVACUATOR 3/16 PVC DRAIN (DRAIN) ×2 IMPLANT
GAUZE SPONGE 4X4 16PLY XRAY LF (GAUZE/BANDAGES/DRESSINGS) ×3 IMPLANT
GLOVE BIOGEL M 8.0 STRL (GLOVE) ×3 IMPLANT
GLOVE BIOGEL PI IND STRL 7.5 (GLOVE) ×3 IMPLANT
GLOVE BIOGEL PI INDICATOR 7.5 (GLOVE) ×6
GLOVE ECLIPSE 8.0 STRL XLNG CF (GLOVE) ×3 IMPLANT
GLOVE EXAM NITRILE LRG STRL (GLOVE) IMPLANT
GLOVE EXAM NITRILE MD LF STRL (GLOVE) ×3 IMPLANT
GLOVE EXAM NITRILE XL STR (GLOVE) IMPLANT
GLOVE EXAM NITRILE XS STR PU (GLOVE) IMPLANT
GLOVE SURG SS PI 7.0 STRL IVOR (GLOVE) ×6 IMPLANT
GOWN STRL REUS W/ TWL LRG LVL3 (GOWN DISPOSABLE) ×1 IMPLANT
GOWN STRL REUS W/ TWL XL LVL3 (GOWN DISPOSABLE) ×2 IMPLANT
GOWN STRL REUS W/TWL 2XL LVL3 (GOWN DISPOSABLE) IMPLANT
GOWN STRL REUS W/TWL LRG LVL3 (GOWN DISPOSABLE) ×2
GOWN STRL REUS W/TWL XL LVL3 (GOWN DISPOSABLE) ×4
KIT BASIN OR (CUSTOM PROCEDURE TRAY) ×3 IMPLANT
KIT ROOM TURNOVER OR (KITS) ×3 IMPLANT
NEEDLE HYPO 18GX1.5 BLUNT FILL (NEEDLE) IMPLANT
NEEDLE HYPO 21X1.5 SAFETY (NEEDLE) IMPLANT
NEEDLE HYPO 25X1 1.5 SAFETY (NEEDLE) IMPLANT
NEEDLE SPNL 20GX3.5 QUINCKE YW (NEEDLE) IMPLANT
NS IRRIG 1000ML POUR BTL (IV SOLUTION) ×3 IMPLANT
PACK LAMINECTOMY NEURO (CUSTOM PROCEDURE TRAY) ×3 IMPLANT
PAD ABD 8X10 STRL (GAUZE/BANDAGES/DRESSINGS) IMPLANT
PAD ARMBOARD 7.5X6 YLW CONV (MISCELLANEOUS) ×9 IMPLANT
PATTIES SURGICAL .5 X1 (DISPOSABLE) ×3 IMPLANT
RUBBERBAND STERILE (MISCELLANEOUS) ×6 IMPLANT
SPONGE GAUZE 4X4 12PLY (GAUZE/BANDAGES/DRESSINGS) ×3 IMPLANT
SPONGE LAP 4X18 X RAY DECT (DISPOSABLE) IMPLANT
SPONGE SURGIFOAM ABS GEL SZ50 (HEMOSTASIS) ×3 IMPLANT
STRIP CLOSURE SKIN 1/2X4 (GAUZE/BANDAGES/DRESSINGS) ×2 IMPLANT
SUT VIC AB 0 CT1 18XCR BRD8 (SUTURE) ×1 IMPLANT
SUT VIC AB 0 CT1 8-18 (SUTURE) ×2
SUT VIC AB 2-0 CP2 18 (SUTURE) ×3 IMPLANT
SUT VIC AB 3-0 SH 8-18 (SUTURE) ×3 IMPLANT
SYR 20CC LL (SYRINGE) IMPLANT
SYR 20ML ECCENTRIC (SYRINGE) ×3 IMPLANT
SYR 5ML LL (SYRINGE) IMPLANT
TAPE CLOTH SURG 4X10 WHT LF (GAUZE/BANDAGES/DRESSINGS) ×3 IMPLANT
TOWEL OR 17X24 6PK STRL BLUE (TOWEL DISPOSABLE) ×3 IMPLANT
TOWEL OR 17X26 10 PK STRL BLUE (TOWEL DISPOSABLE) ×3 IMPLANT
WATER STERILE IRR 1000ML POUR (IV SOLUTION) ×3 IMPLANT

## 2013-10-31 NOTE — Anesthesia Preprocedure Evaluation (Addendum)
Anesthesia Evaluation  Patient identified by MRN, date of birth, ID band Patient awake  General Assessment Comment:Hx of urinary retention after surgery  Reviewed: Allergy & Precautions, H&P , Patient's Chart, lab work & pertinent test results  History of Anesthesia Complications (+) history of anesthetic complications  Airway Mallampati: I TM Distance: >3 FB Neck ROM: Full    Dental  (+) Edentulous Upper, Edentulous Lower, Dental Advisory Given   Pulmonary former smoker,          Cardiovascular hypertension,     Neuro/Psych  Neuromuscular disease    GI/Hepatic Neg liver ROS, Hx of colon ca   Endo/Other    Renal/GU      Musculoskeletal  (+) Arthritis -,   Abdominal   Peds  Hematology negative hematology ROS (+)   Anesthesia Other Findings   Reproductive/Obstetrics                         Anesthesia Physical Anesthesia Plan  ASA: III  Anesthesia Plan: General   Post-op Pain Management:    Induction: Intravenous  Airway Management Planned: Oral ETT  Additional Equipment:   Intra-op Plan:   Post-operative Plan: Extubation in OR  Informed Consent: I have reviewed the patients History and Physical, chart, labs and discussed the procedure including the risks, benefits and alternatives for the proposed anesthesia with the patient or authorized representative who has indicated his/her understanding and acceptance.   Dental advisory given  Plan Discussed with: CRNA and Surgeon  Anesthesia Plan Comments:         Anesthesia Quick Evaluation

## 2013-10-31 NOTE — Transfer of Care (Signed)
Immediate Anesthesia Transfer of Care Note  Patient: Andre Pollard  Procedure(s) Performed: Procedure(s) with comments: L4-5 Laminectomy (Bilateral) - Lumbar Four-five Laminectomy and Foraminotomy  Patient Location: PACU  Anesthesia Type:General  Level of Consciousness: awake, alert  and patient cooperative  Airway & Oxygen Therapy: Patient Spontanous Breathing and Patient connected to nasal cannula oxygen  Post-op Assessment: Report given to PACU RN, Post -op Vital signs reviewed and stable and Patient moving all extremities X 4  Post vital signs: Reviewed and stable  Complications: No apparent anesthesia complications

## 2013-10-31 NOTE — Anesthesia Procedure Notes (Signed)
Procedure Name: Intubation Date/Time: 10/31/2013 12:05 PM Performed by: Trixie Deis A Pre-anesthesia Checklist: Patient identified, Timeout performed, Emergency Drugs available, Suction available and Patient being monitored Patient Re-evaluated:Patient Re-evaluated prior to inductionOxygen Delivery Method: Circle system utilized Preoxygenation: Pre-oxygenation with 100% oxygen Intubation Type: IV induction Ventilation: Mask ventilation without difficulty Laryngoscope Size: Mac and 3 Grade View: Grade I Tube type: Oral Tube size: 7.5 mm Number of attempts: 1 Airway Equipment and Method: Stylet Secured at: 23 cm Tube secured with: Tape Dental Injury: Teeth and Oropharynx as per pre-operative assessment

## 2013-11-01 MED ORDER — ZOLPIDEM TARTRATE 5 MG PO TABS
5.0000 mg | ORAL_TABLET | Freq: Every evening | ORAL | Status: DC | PRN
  Administered 2013-11-01: 5 mg via ORAL
  Filled 2013-11-01: qty 1

## 2013-11-01 NOTE — Progress Notes (Signed)
Spoke with Dr. Joya Salm, pt's foley to stay in place due to his history of urinary retention.

## 2013-11-01 NOTE — Op Note (Signed)
NAMEMarland Kitchen  Andre Pollard, Andre Pollard              ACCOUNT NO.:  [id number]  MEDICAL RECORD NO.:  [id number]  LOCATION:  [id number]                        FACILITY:  [facility]  PHYSICIAN:  Leeroy Cha, M.D.   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  PROCEDURE:  Bilateral L4-5 laminectomy, foraminotomy to decompress the L4, L5 nerve root.  Microscope.  SURGEON:  Leeroy Cha, M.D.  ASSISTANT:  Dr. Hal Neer  CLINICAL HISTORY:  [name] is an 80 year old gentleman seen in my office complaining of back pain worsened to both legs, as to the point walking more than a block, he has to sit and flex his back to minimize the pain.  MRI shows severe stenosis at L4-5.  The patient want to proceed with surgery.  The risks were fully explained to him and his daughter.  PROCEDURE IN DETAIL:  The patient was taken to the OR, and after intubation, he was positioned in a prone manner.  The back was cleaned with DuraPrep and drapes were applied.  Midline incision from L3-4 down to L5-S1 was made.  The incision was carried out all the way down to the spinous process, and muscles were dealt bilaterally.  X-rays showed_thjat we were_____ at the L4-5.  Then, we proceeded with removal of spinous pwrocess of L5 and L4 with the drill.  We removed the lamina of bilateral L4-5. Indeed at the level of L4-5, the ligament was calcified.  It was quite narrow with thinning of the dura mater.  Using the 1, 2, and 3 mm Kerrison punch, we were able to decompress the thecal sac.  At the end, we had good space below and above.  Foraminotomy to decompress the L4, L5, and S1 nerve root was achieved.  The area was irrigated.  Valsalva maneuver was negative.  The drain was left  in the epidural space and the wound was closed with Vicryl and Steri-Strips.          ______________________________ Leeroy Cha, M.D.     EB/MEDQ  D:  10/31/2013  T:  11/01/2013  Job:  [id number]

## 2013-11-01 NOTE — Progress Notes (Signed)
Patient has been restless and uncomfortable tonight with regards to his inability to urinate. He has been voiding very small amounts this evening. The bladder scanner showed 437mL at 2345. Will in and out cath once more. Next event will be an indwelling catheter. Daughter states that the pt. sees a urologist and had this issue with his last surgery. Previously he maintained the catheter for 7-10 days post-op. Will continue to monitor. Andre Pollard, Rande Brunt

## 2013-11-01 NOTE — Progress Notes (Signed)
Occupational Therapy Evaluation Patient Details Name: Andre Pollard MRN: [id number] DOB: 03-27-1930 Today's Date: 11/01/2013    History of Present Illness Andre Pollard is an 80 y.o. Male admitted for Bil LE pain and is s/p L4-5 laminectomy on 10/31/13.   Clinical Impression   PTA pt lived at home with his wife and was independent with use of assistive devices for ADLs and functional mobility. Pt requires max VC's for back precautions and will require frequent education on precautions. Pt overall moving well and requires assist for LB ADLs. Daughter reports that pt's wife is disabled from a stroke and is unable to physically assist, however pt's family lives nearby and will check in frequently. Pt would benefit from skilled OT to increase independence and knowledge of precautions prior to d/c home.     Follow Up Recommendations  Supervision/Assistance - 24 hour;No OT follow up    Equipment Recommendations  None recommended by OT       Precautions / Restrictions Precautions Precautions: Back;Fall Precaution Booklet Issued: Yes (comment) Precaution Comments: Educated pt and daughter extensively on 3/3 back precautions and incorporating into ADLs with demonstrations. Pt will require heavy education for precautions.  Restrictions Weight Bearing Restrictions: No      Mobility Bed Mobility Overal bed mobility: Needs Assistance Bed Mobility: Rolling;Sidelying to Sit;Sit to Sidelying Rolling: Min guard Sidelying to sit: Min guard     Sit to sidelying: Min guard General bed mobility comments: Max VC's for sequencing and pt will move quickly. Will need much practice before safe technique.  Transfers Overall transfer level: Needs assistance Equipment used: Rolling walker (2 wheeled) Transfers: Sit to/from Stand Sit to Stand: Min guard         General transfer comment: cues for hand placement and safe technique         ADL Overall ADL's : Needs  assistance/impaired Eating/Feeding: Independent;Sitting   Grooming: Set up;Sitting   Upper Body Bathing: Set up;Supervision/ safety;Sitting   Lower Body Bathing: Minimal assistance;Sit to/from stand   Upper Body Dressing : Set up;Supervision/safety;Sitting   Lower Body Dressing: Sit to/from stand;Moderate assistance   Toilet Transfer: Min guard;Ambulation;RW (sit<>stand)   Toileting- Clothing Manipulation and Hygiene: Minimal assistance;Sit to/from stand;Cueing for back precautions   Tub/ Shower Transfer: Walk-in shower;Min guard;Ambulation;Shower seat;Rolling walker   Functional mobility during ADLs: Min guard;Rolling walker General ADL Comments: Pt moves quickly and somewhat impulsively. Educated extensively on back precautions with pt able to recall 1/3 back precautions. Educated pt and daughter on fall prevention techniques to decrease risk of falls at home and energy conservation techniques.      Vision  Per pt report, no change from baseline. No apparent visual deficits.   Pt's right eye does not close from old injury.                  Perception Perception Perception Tested?: No   Praxis Praxis Praxis tested?: Within functional limits    Pertinent Vitals/Pain NAD; no c/o pain     Hand Dominance Right   Extremity/Trunk Assessment Upper Extremity Assessment Upper Extremity Assessment: Overall WFL for tasks assessed   Lower Extremity Assessment Lower Extremity Assessment: Overall WFL for tasks assessed   Cervical / Trunk Assessment Cervical / Trunk Assessment: Normal   Communication Communication Communication: No difficulties;Other (comment) (has hearing aids)   Cognition Arousal/Alertness: Awake/alert Behavior During Therapy: WFL for tasks assessed/performed Overall Cognitive Status: Within Functional Limits for tasks assessed  Home Living Family/patient expects to be discharged to:: Private  residence Living Arrangements: Spouse/significant other (wife is disabled from stroke) Available Help at Discharge: Family;Available 24 hours/day;Available PRN/intermittently (wife will be home but is disabled; family lives nearby avail) Type of Home: House Home Access: Level entry     Home Layout: Two level;Able to live on main level with bedroom/bathroom Alternate Level Stairs-Number of Steps: flight to basement Alternate Level Stairs-Rails: Right;Left Bathroom Shower/Tub: Walk-in shower         Home Equipment: Environmental consultant - 2 wheels;Cane - single point;Shower seat;Grab bars - tub/shower;Grab bars - toilet   Additional Comments: has a 89 y/o small dog that he likes to walk; daughter reports that she will walk the dog to decrease fall risk.      Prior Functioning/Environment Level of Independence: Independent with assistive device(s)             OT Diagnosis: Generalized weakness;Acute pain   OT Problem List: Decreased range of motion;Decreased activity tolerance;Impaired balance (sitting and/or standing);Decreased safety awareness;Decreased knowledge of use of DME or AE;Decreased knowledge of precautions   OT Treatment/Interventions: Self-care/ADL training;Energy conservation;DME and/or AE instruction;Therapeutic activities;Patient/family education;Balance training    OT Goals(Current goals can be found in the care plan section) Acute Rehab OT Goals Patient Stated Goal: independence at home and to take care of wife OT Goal Formulation: With patient/family Time For Goal Achievement: 11/08/13 Potential to Achieve Goals: Good ADL Goals Pt Will Perform Grooming: with modified independence;standing Pt Will Perform Lower Body Bathing: with modified independence;with adaptive equipment;sit to/from stand Pt Will Perform Lower Body Dressing: with modified independence;with adaptive equipment;sit to/from stand Pt Will Transfer to Toilet: with modified independence;ambulating;regular  height toilet Pt Will Perform Toileting - Clothing Manipulation and hygiene: with modified independence;sit to/from stand;with adaptive equipment Pt Will Perform Tub/Shower Transfer: Shower transfer;with modified independence;ambulating;shower seat;rolling walker Additional ADL Goal #1: Pt will correctly state 3/3 back precautions Independently for safety with ADLs.  Additional ADL Goal #2: Pt will perform bed mobility at Mod Independent level using log roll technique to prepare for ADLs.  OT Frequency: Min 3X/week              End of Session Equipment Utilized During Treatment: Gait belt;Rolling walker Nurse Communication: Precautions  Activity Tolerance: Patient tolerated treatment well Patient left: in bed;with call bell/phone within reach;with bed alarm set;with family/visitor present   Time: 1791-5056 OT Time Calculation (min): 41 min Charges:  OT General Charges $OT Visit: 1 Procedure OT Evaluation $Initial OT Evaluation Tier I: 1 Procedure OT Treatments $Self Care/Home Management : 23-37 mins  Juluis Rainier [phone] 11/01/2013, 4:52 PM

## 2013-11-01 NOTE — Evaluation (Signed)
Physical Therapy Evaluation Patient Details Name: Andre Pollard MRN: [id number] DOB: September 30, 1929 Today's Date: 11/01/2013   History of Present Illness  Admitted for bilateral leg pain s/p L45 laminectomy.  Clinical Impression  Pt admitted with/for lumbar decompression surgery.  Pt currently limited functionally due to the problems listed below.  (see problems list.)  Pt will benefit from PT to maximize function and safety to be able to get home safely with available assist of family.     Follow Up Recommendations No PT follow up    Equipment Recommendations  None recommended by PT    Recommendations for Other Services       Precautions / Restrictions Precautions Precautions: Back Restrictions Weight Bearing Restrictions: No      Mobility  Bed Mobility Overal bed mobility: Needs Assistance Bed Mobility: Rolling;Sidelying to Sit Rolling: Min guard Sidelying to sit: Min guard       General bed mobility comments: cues for best technique, will need further practice.  Transfers Overall transfer level: Needs assistance Equipment used: Rolling walker (2 wheeled) Transfers: Sit to/from Stand Sit to Stand: Min guard         General transfer comment: cues for hand placement and safe technique  Ambulation/Gait Ambulation/Gait assistance: Min guard Ambulation Distance (Feet): 400 Feet Assistive device: Rolling walker (2 wheeled) Gait Pattern/deviations: Step-through pattern Gait velocity: functional speed   General Gait Details: generally steady gait with RW, but wanders off to right when not focused  Stairs            Wheelchair Mobility    Modified Rankin (Stroke Patients Only)       Balance Overall balance assessment: Needs assistance Sitting-balance support: No upper extremity supported Sitting balance-Leahy Scale: Fair     Standing balance support: No upper extremity supported Standing balance-Leahy Scale: Fair                               Pertinent Vitals/Pain No pain    Home Living Family/patient expects to be discharged to:: Private residence Living Arrangements: Spouse/significant other Available Help at Discharge: Family;Available 24 hours/day Type of Home: House Home Access: Level entry     Home Layout: Two level;Able to live on main level with bedroom/bathroom Home Equipment: Gilford Rile - 2 wheels;Cane - single point;Shower seat;Grab bars - tub/shower;Grab bars - toilet Additional Comments: has a 61 y/o small dog that he likes to walk    Prior Function Level of Independence: Independent with assistive device(s)               Hand Dominance        Extremity/Trunk Assessment   Upper Extremity Assessment: Defer to OT evaluation           Lower Extremity Assessment: Overall WFL for tasks assessed         Communication   Communication: No difficulties  Cognition Arousal/Alertness: Awake/alert Behavior During Therapy: WFL for tasks assessed/performed Overall Cognitive Status: Within Functional Limits for tasks assessed                      General Comments General comments (skin integrity, edema, etc.): Educated pt/son in back care/prec, log roll, advised lifting restrictions and progressiong of activity.    Exercises        Assessment/Plan    PT Assessment Patient needs continued PT services  PT Diagnosis  (decr activity tolerance)   PT Problem List Decreased activity tolerance;Decreased  mobility;Decreased knowledge of use of DME;Decreased knowledge of precautions  PT Treatment Interventions DME instruction;Gait training;Functional mobility training;Therapeutic activities;Stair training;Patient/family education   PT Goals (Current goals can be found in the Care Plan section) Acute Rehab PT Goals Patient Stated Goal: back home independent PT Goal Formulation: With patient Time For Goal Achievement: 11/08/13 Potential to Achieve Goals: Good    Frequency Min 5X/week    Barriers to discharge        Co-evaluation               End of Session   Activity Tolerance: Patient tolerated treatment well Patient left: in chair;with call bell/phone within reach;with family/visitor present Nurse Communication: Mobility status         Time: 5830-9407 PT Time Calculation (min): 32 min   Charges:   PT Evaluation $Initial PT Evaluation Tier I: 1 Procedure PT Treatments $Gait Training: 8-22 mins $Self Care/Home Management: 8-22   PT G Codes:          Andre Pollard, Tessie Fass 11/01/2013, 10:50 AM 11/01/2013  Donnella Sham, PT [phone]  (pager)

## 2013-11-01 NOTE — Progress Notes (Signed)
UR complete.  Jovana Rembold RN, MSN 

## 2013-11-01 NOTE — Progress Notes (Signed)
Patient ID: Andre Pollard, male   DOB: 19-Jan-1930, 80 y.o.   MRN: [id number] No pain, ambulating, hemovac out. Family agrees to dc foley

## 2013-11-01 NOTE — Progress Notes (Signed)
This nurse called and left a message with Dr. Harley Hallmark office regarding pt's foley.  Awaiting order's. Cori Razor, RN 11/01/2013 10:50 AM

## 2013-11-02 NOTE — Progress Notes (Signed)
MD was paged because patient is unable to void due to urinary retention.  Will wait for further orders.  Will continue to monitor patient.

## 2013-11-02 NOTE — Progress Notes (Signed)
Physical Therapy Treatment Patient Details Name: Andre Pollard MRN: [id number] DOB: 1929/10/24 Today's Date: 11/02/2013    History of Present Illness Andre Pollard is an 80 y.o. Male admitted for Bil LE pain and is s/p L4-5 laminectomy on 10/31/13.    PT Comments    Progressing well, but will continue to need cues for safety  Follow Up Recommendations  No PT follow up     Equipment Recommendations  None recommended by PT    Recommendations for Other Services       Precautions / Restrictions Precautions Precautions: Back;Fall    Mobility  Bed Mobility Overal bed mobility: Needs Assistance Bed Mobility: Rolling;Sidelying to Sit Rolling: Min guard Sidelying to sit: Min guard       General bed mobility comments: step by step cues for sequencing and technique.  minor use of the rail  Transfers Overall transfer level: Needs assistance Equipment used: Rolling walker (2 wheeled) Transfers: Sit to/from Stand Sit to Stand: Min guard         General transfer comment: cues for hand placement and safe technique  Ambulation/Gait Ambulation/Gait assistance: Min guard Ambulation Distance (Feet): 300 Feet Assistive device: Rolling walker (2 wheeled) Gait Pattern/deviations: Step-through pattern Gait velocity: functional speed   General Gait Details: generally steady, still wandering with loss of focus which can be frequently   Stairs Stairs: Yes Stairs assistance: Min guard Stair Management: One rail Right;Alternating pattern;Forwards Number of Stairs: 12 General stair comments: steady with rain.  cues for safest technique  Wheelchair Mobility    Modified Rankin (Stroke Patients Only)       Balance Overall balance assessment: Needs assistance Sitting-balance support: Feet supported;No upper extremity supported Sitting balance-Leahy Scale: Good       Standing balance-Leahy Scale: Fair                      Cognition Arousal/Alertness:  Awake/alert Behavior During Therapy: WFL for tasks assessed/performed Overall Cognitive Status: Within Functional Limits for tasks assessed                      Exercises      General Comments General comments (skin integrity, edema, etc.): reinforced back care and precautions, log roll and typical progression of activity with daughter earlier today and with pt during session.      Pertinent Vitals/Pain     Home Living                      Prior Function            PT Goals (current goals can now be found in the care plan section) Acute Rehab PT Goals Patient Stated Goal: independence at home and to take care of wife PT Goal Formulation: With patient Time For Goal Achievement: 11/08/13 Potential to Achieve Goals: Good Progress towards PT goals: Progressing toward goals    Frequency  Min 5X/week    PT Plan Current plan remains appropriate    Co-evaluation             End of Session   Activity Tolerance: Patient tolerated treatment well Patient left: in chair;with call bell/phone within reach;with family/visitor present     Time: 9326-7124 PT Time Calculation (min): 25 min  Charges:  $Gait Training: 8-22 mins $Therapeutic Activity: 8-22 mins                    G Codes:  Andre Pollard, Tessie Fass 11/02/2013, 4:36 PM 11/02/2013  Donnella Sham, [place] [phone]  (pager)

## 2013-11-02 NOTE — Progress Notes (Signed)
Occupational Therapy Treatment Patient Details Name: Andre Pollard MRN: [id number] DOB: Jan 03, 1930 Today's Date: 11/02/2013    History of present illness Andre Pollard is an 80 y.o. Male admitted for Bil LE pain and is s/p L4-5 laminectomy on 10/31/13.   OT comments  Pt seen today for functional mobility and ADLs. Pt reports that this day was the worst in terms of pain and he had negative feelings of despair, however after some rest pt is feeling more optimistic and pain is well controlled. Pt continues to require VC's for maintaining back precautions, however is moving well and performed grooming at sink. Pt would benefit from continued skilled OT to promote independence prior to d/c.    Follow Up Recommendations  Supervision/Assistance - 24 hour;No OT follow up    Equipment Recommendations  None recommended by OT       Precautions / Restrictions Precautions Precautions: Back;Fall Precaution Comments: Reviewed 3/3 back precautions with pt and incorporating into ADLs. Pt requires VC's to maintain back precautions throughout session.  Restrictions Weight Bearing Restrictions: No       Mobility Bed Mobility Overal bed mobility: Needs Assistance Bed Mobility: Rolling;Sidelying to Sit Rolling: Min guard Sidelying to sit: Min guard       General bed mobility comments: VC's for sequencing. HOB flat and no rail.  Transfers Overall transfer level: Needs assistance Equipment used: Rolling walker (2 wheeled) Transfers: Sit to/from Stand Sit to Stand: Min guard         General transfer comment: Min guard for safety; VC's for hand placement    Balance Overall balance assessment: Needs assistance Sitting-balance support: Feet supported;No upper extremity supported Sitting balance-Leahy Scale: Good       Standing balance-Leahy Scale: Fair                     ADL Overall ADL's : Needs assistance/impaired     Grooming: Wash/dry hands;Wash/dry face;Min  Dispensing optician: Min guard;Ambulation;RW           Functional mobility during ADLs: Min guard;Rolling walker General ADL Comments: Pt continues to require VC's to maintain back precautions and unable to recall precautions independently.                 Cognition  Arousal/Alertness: Awake/Alert Behavior During Therapy: WFL for tasks assessed/performed Overall Cognitive Status: Within Functional Limits for tasks assessed                                    Pertinent Vitals/ Pain       NAD         Frequency Min 3X/week     Progress Toward Goals  OT Goals(current goals can now be found in the care plan section)  Progress towards OT goals: Progressing toward goals  Acute Rehab OT Goals Patient Stated Goal: independence at home and to take care of wife ADL Goals Pt Will Perform Grooming: with modified independence;standing Pt Will Perform Lower Body Bathing: with modified independence;with adaptive equipment;sit to/from stand Pt Will Perform Lower Body Dressing: with modified independence;with adaptive equipment;sit to/from stand Pt Will Transfer to Toilet: with modified independence;ambulating;regular height toilet Pt Will Perform Toileting - Clothing Manipulation and hygiene: with modified independence;sit to/from stand;with adaptive equipment Pt Will Perform Tub/Shower Transfer: Shower transfer;with modified independence;ambulating;shower seat;rolling  walker Additional ADL Goal #1: Pt will correctly state 3/3 back precautions Independently for safety with ADLs.  Additional ADL Goal #2: Pt will perform bed mobility at Mod Independent level using log roll technique to prepare for ADLs.  Plan Discharge plan remains appropriate       End of Session Equipment Utilized During Treatment: Rolling walker;Gait belt   Activity Tolerance Patient tolerated treatment well   Patient Left in chair;with call bell/phone within  reach;with chair alarm set   Nurse Communication Mobility status        Time: 6283-1517 OT Time Calculation (min): 29 min  Charges: OT General Charges $OT Visit: 1 Procedure OT Treatments $Self Care/Home Management : 23-37 mins  Juluis Rainier  [phone]  11/02/2013, 5:58 PM

## 2013-11-02 NOTE — Progress Notes (Signed)
Patient ID: Andre Pollard, male   DOB: July 02, 1929, 80 y.o.   MRN: [id number] Miserable night. Foley was not re-inserted as ordered. New foley today and possible discharge in am as we agreed before surgery

## 2013-11-02 NOTE — Progress Notes (Signed)
Coude catheter has arrived. Lynden Ang, RN from  [employer] placed [employer].  Patient tolerated well.  Will continue to monitor patient.

## 2013-11-02 NOTE — Progress Notes (Signed)
MD returned page and ordered [facility] placement for patient.  Coude Catheter has been requested from materials and a Building services engineer has been called to help with placement.  Will continue to monitor patient.  Awaiting for materials to send materials requested.

## 2013-11-03 ENCOUNTER — Encounter (HOSPITAL_COMMUNITY): Payer: Self-pay | Admitting: Neurosurgery

## 2013-11-03 NOTE — Care Management Note (Addendum)
  Page 1 of 1   11/03/2013     10:09:58 AM CARE MANAGEMENT NOTE 11/03/2013  Patient:  Andre Pollard   Account Number:  [id number]  Date Initiated:  11/01/2013  Documentation initiated by:  Lorne Skeens  Subjective/Objective Assessment:   Patient was admitted for L4-5 Laminectomy. Lives at home with spouse.     Action/Plan:   Will follow for discharge needs pending PT/OT evals and physician orders.   Anticipated DC Date:     Anticipated DC Plan:           Choice offered to / List presented to:             Status of service:   Medicare Important Message given?  YES (If response is "NO", the following Medicare IM given date fields will be blank) Date Medicare IM given:  11/03/2013 Medicare IM given by:  Lorne Skeens Date Additional Medicare IM given:   Additional Medicare IM given by:    Discharge Disposition:    Per UR Regulation:  Reviewed for med. necessity/level of care/duration of stay  If discussed at [place] of Stay Meetings, dates discussed:    Comments:  11/03/13 [place], MSN, CM- met with patient and family to provide Medicare IM letter.

## 2013-11-03 NOTE — Progress Notes (Signed)
CSW received referral for questionable SNF.   Chart reviewed. PT recommended No PT follow up.    CSW will sign off. Please re-consult is CSW needs arise.    [facility]  [address];  [phone] Phone: [phone]

## 2013-11-03 NOTE — Discharge Summary (Signed)
Physician Discharge Summary  Patient ID: Andre Pollard MRN: [id number] DOB/AGE: 11-15-1929 80 y.o.  Admit date: 10/31/2013 Discharge date: 11/03/2013  Admission Diagnoses: Lumbar stenosis Discharge Diagnoses:  Active Problems:   Lumbar stenosis with neurogenic claudication urinary retention  Discharged Condition:no pain. Foley in place  Hospital Course: surgery  Consults: none  Significant Diagnostic Studies: mri  Treatments: lumbar decompression  Discharge Exam: Blood pressure 130/66, pulse 83, temperature 98.3 F (36.8 C), temperature source Oral, resp. rate 18, height 5\' 11"  (1.803 m), weight 192 lb 8 oz (87.317 kg), SpO2 94.00%. No weakness. Foley in place  Disposition: home . To see GU in 1 week. To see me in 3 weeks     Medication List    ASK your doctor about these medications       aspirin EC 81 MG tablet  Take 81 mg by mouth daily.     donepezil 10 MG tablet  Commonly known as:  ARICEPT  Take 10 mg by mouth every evening.     HYDROcodone-acetaminophen 5-325 MG per tablet  Commonly known as:  NORCO/VICODIN  Take 1 tablet by mouth every 4 (four) hours as needed for moderate pain.     metoprolol tartrate 25 MG tablet  Commonly known as:  LOPRESSOR  Take 25 mg by mouth daily.     naproxen 500 MG tablet  Commonly known as:  NAPROSYN  Take 500 mg by mouth 2 (two) times daily with a meal.     NON FORMULARY  Take 5 mLs by mouth daily. Extra virgin olive oil     OVER THE COUNTER MEDICATION  Place 1 application into the right eye at bedtime. LUBRICATING SALINE GEL.     OVER THE COUNTER MEDICATION  Place 1 drop into the right eye 2 (two) times daily as needed (for lubrication). LUBRICATING SALINE DROPS     sertraline 50 MG tablet  Commonly known as:  ZOLOFT  Take 50 mg by mouth every evening.     simvastatin 40 MG tablet  Commonly known as:  ZOCOR  Take 40 mg by mouth at bedtime.     tamsulosin 0.4 MG Caps capsule  Commonly known as:  FLOMAX   Take 0.4 mg by mouth Daily.         Signed: Floyce Stakes 11/03/2013, 11:05 AM

## 2013-11-03 NOTE — Progress Notes (Signed)
Patient to be discharged home with foley.  Hemovac removed as well as IV and prescriptions given to patient by Dr. Joya Salm.  Patient to be driven home via his daughter.  Kizzie Bane, RN

## 2013-11-03 NOTE — Anesthesia Postprocedure Evaluation (Signed)
  Anesthesia Post-op Note  Patient: Andre Pollard  Procedure(s) Performed: Procedure(s) with comments: L4-5 Laminectomy (Bilateral) - Lumbar Four-five Laminectomy and Foraminotomy  Patient Location: PACU  Anesthesia Type:General  Level of Consciousness: awake and alert   Airway and Oxygen Therapy: Patient Spontanous Breathing  Post-op Pain: mild  Post-op Assessment: Post-op Vital signs reviewed  Post-op Vital Signs: stable  Last Vitals:  Filed Vitals:   11/03/13 1026  BP: 130/66  Pulse: 83  Temp: 36.8 C  Resp: 18    Complications: No apparent anesthesia complications

## 2013-12-29 ENCOUNTER — Ambulatory Visit (INDEPENDENT_AMBULATORY_CARE_PROVIDER_SITE_OTHER): Payer: Medicare Other | Admitting: Urology

## 2013-12-29 DIAGNOSIS — R972 Elevated prostate specific antigen [PSA]: Secondary | ICD-10-CM

## 2013-12-29 DIAGNOSIS — R82998 Other abnormal findings in urine: Secondary | ICD-10-CM

## 2013-12-29 DIAGNOSIS — N401 Enlarged prostate with lower urinary tract symptoms: Secondary | ICD-10-CM

## 2014-03-01 ENCOUNTER — Ambulatory Visit (HOSPITAL_COMMUNITY)
Admission: RE | Admit: 2014-03-01 | Discharge: 2014-03-01 | Disposition: A | Discharge status: Home / Self Care | Payer: Medicare Other | Source: Ambulatory Visit | Attending: Pulmonary Disease | Admitting: Pulmonary Disease

## 2014-03-01 ENCOUNTER — Other Ambulatory Visit (HOSPITAL_COMMUNITY): Payer: Self-pay | Admitting: Pulmonary Disease

## 2014-03-01 DIAGNOSIS — R0789 Other chest pain: Secondary | ICD-10-CM

## 2014-03-01 DIAGNOSIS — R079 Chest pain, unspecified: Secondary | ICD-10-CM | POA: Diagnosis present

## 2014-03-01 DIAGNOSIS — R918 Other nonspecific abnormal finding of lung field: Secondary | ICD-10-CM | POA: Insufficient documentation

## 2014-03-01 DIAGNOSIS — Z85820 Personal history of malignant melanoma of skin: Secondary | ICD-10-CM | POA: Diagnosis not present

## 2014-03-01 DIAGNOSIS — Z87891 Personal history of nicotine dependence: Secondary | ICD-10-CM | POA: Insufficient documentation

## 2014-03-01 DIAGNOSIS — Z85038 Personal history of other malignant neoplasm of large intestine: Secondary | ICD-10-CM | POA: Diagnosis not present

## 2014-03-01 DIAGNOSIS — I10 Essential (primary) hypertension: Secondary | ICD-10-CM | POA: Diagnosis not present

## 2014-03-02 ENCOUNTER — Other Ambulatory Visit (HOSPITAL_COMMUNITY): Payer: Self-pay | Admitting: Pulmonary Disease

## 2014-03-02 DIAGNOSIS — C189 Malignant neoplasm of colon, unspecified: Secondary | ICD-10-CM

## 2014-03-02 DIAGNOSIS — R918 Other nonspecific abnormal finding of lung field: Secondary | ICD-10-CM

## 2014-03-04 ENCOUNTER — Emergency Department (HOSPITAL_COMMUNITY)
Admission: EM | Admit: 2014-03-04 | Discharge: 2014-03-04 | Disposition: A | Discharge status: Home / Self Care | Payer: Medicare Other | Attending: Emergency Medicine | Admitting: Emergency Medicine

## 2014-03-04 ENCOUNTER — Emergency Department (HOSPITAL_COMMUNITY): Payer: Medicare Other

## 2014-03-04 ENCOUNTER — Encounter (HOSPITAL_COMMUNITY): Payer: Self-pay | Admitting: Cardiology

## 2014-03-04 DIAGNOSIS — F0390 Unspecified dementia without behavioral disturbance: Secondary | ICD-10-CM | POA: Insufficient documentation

## 2014-03-04 DIAGNOSIS — M479 Spondylosis, unspecified: Secondary | ICD-10-CM | POA: Insufficient documentation

## 2014-03-04 DIAGNOSIS — Z7982 Long term (current) use of aspirin: Secondary | ICD-10-CM | POA: Diagnosis not present

## 2014-03-04 DIAGNOSIS — C7801 Secondary malignant neoplasm of right lung: Secondary | ICD-10-CM | POA: Insufficient documentation

## 2014-03-04 DIAGNOSIS — M25551 Pain in right hip: Secondary | ICD-10-CM | POA: Insufficient documentation

## 2014-03-04 DIAGNOSIS — I10 Essential (primary) hypertension: Secondary | ICD-10-CM | POA: Insufficient documentation

## 2014-03-04 DIAGNOSIS — Z87891 Personal history of nicotine dependence: Secondary | ICD-10-CM | POA: Insufficient documentation

## 2014-03-04 DIAGNOSIS — E780 Pure hypercholesterolemia: Secondary | ICD-10-CM | POA: Insufficient documentation

## 2014-03-04 DIAGNOSIS — Z923 Personal history of irradiation: Secondary | ICD-10-CM | POA: Diagnosis not present

## 2014-03-04 DIAGNOSIS — C7802 Secondary malignant neoplasm of left lung: Secondary | ICD-10-CM | POA: Insufficient documentation

## 2014-03-04 DIAGNOSIS — H9190 Unspecified hearing loss, unspecified ear: Secondary | ICD-10-CM | POA: Insufficient documentation

## 2014-03-04 DIAGNOSIS — R1012 Left upper quadrant pain: Secondary | ICD-10-CM

## 2014-03-04 DIAGNOSIS — Z79899 Other long term (current) drug therapy: Secondary | ICD-10-CM | POA: Diagnosis not present

## 2014-03-04 DIAGNOSIS — Z87442 Personal history of urinary calculi: Secondary | ICD-10-CM | POA: Diagnosis not present

## 2014-03-04 DIAGNOSIS — C4359 Malignant melanoma of other part of trunk: Secondary | ICD-10-CM | POA: Insufficient documentation

## 2014-03-04 DIAGNOSIS — Z9889 Other specified postprocedural states: Secondary | ICD-10-CM | POA: Insufficient documentation

## 2014-03-04 DIAGNOSIS — M549 Dorsalgia, unspecified: Secondary | ICD-10-CM | POA: Insufficient documentation

## 2014-03-04 DIAGNOSIS — Z8669 Personal history of other diseases of the nervous system and sense organs: Secondary | ICD-10-CM | POA: Diagnosis not present

## 2014-03-04 DIAGNOSIS — F329 Major depressive disorder, single episode, unspecified: Secondary | ICD-10-CM | POA: Diagnosis not present

## 2014-03-04 DIAGNOSIS — R52 Pain, unspecified: Secondary | ICD-10-CM

## 2014-03-04 DIAGNOSIS — R079 Chest pain, unspecified: Secondary | ICD-10-CM | POA: Diagnosis not present

## 2014-03-04 DIAGNOSIS — Z85038 Personal history of other malignant neoplasm of large intestine: Secondary | ICD-10-CM | POA: Diagnosis not present

## 2014-03-04 LAB — CBC WITH DIFFERENTIAL/PLATELET
BASOS PCT: 0 % (ref 0–1)
Basophils Absolute: 0 10*3/uL (ref 0.0–0.1)
Eosinophils Absolute: 0.1 10*3/uL (ref 0.0–0.7)
Eosinophils Relative: 1 % (ref 0–5)
HCT: 41.3 % (ref 39.0–52.0)
HEMOGLOBIN: 14.1 g/dL (ref 13.0–17.0)
LYMPHS ABS: 0.9 10*3/uL (ref 0.7–4.0)
Lymphocytes Relative: 13 % (ref 12–46)
MCH: 29.1 pg (ref 26.0–34.0)
MCHC: 34.1 g/dL (ref 30.0–36.0)
MCV: 85.3 fL (ref 78.0–100.0)
MONO ABS: 0.8 10*3/uL (ref 0.1–1.0)
Monocytes Relative: 11 % (ref 3–12)
Neutro Abs: 5.2 10*3/uL (ref 1.7–7.7)
Neutrophils Relative %: 75 % (ref 43–77)
Platelets: 157 10*3/uL (ref 150–400)
RBC: 4.84 MIL/uL (ref 4.22–5.81)
RDW: 12.8 % (ref 11.5–15.5)
WBC: 7 10*3/uL (ref 4.0–10.5)

## 2014-03-04 LAB — COMPREHENSIVE METABOLIC PANEL
ALBUMIN: 3.2 g/dL — AB (ref 3.5–5.2)
ALT: 10 U/L (ref 0–53)
ANION GAP: 10 (ref 5–15)
AST: 11 U/L (ref 0–37)
Alkaline Phosphatase: 115 U/L (ref 39–117)
BILIRUBIN TOTAL: 0.2 mg/dL — AB (ref 0.3–1.2)
BUN: 13 mg/dL (ref 6–23)
CHLORIDE: 106 meq/L (ref 96–112)
CO2: 28 mEq/L (ref 19–32)
CREATININE: 0.85 mg/dL (ref 0.50–1.35)
Calcium: 9 mg/dL (ref 8.4–10.5)
GFR, EST NON AFRICAN AMERICAN: 79 mL/min — AB (ref >90)
GLUCOSE: 105 mg/dL — AB (ref 70–99)
Potassium: 4.4 mEq/L (ref 3.7–5.3)
Sodium: 144 mEq/L (ref 137–147)
Total Protein: 6.6 g/dL (ref 6.0–8.3)

## 2014-03-04 LAB — LIPASE, BLOOD: Lipase: 38 U/L (ref 11–59)

## 2014-03-04 LAB — URINALYSIS, ROUTINE W REFLEX MICROSCOPIC
BILIRUBIN URINE: NEGATIVE
Glucose, UA: NEGATIVE mg/dL
HGB URINE DIPSTICK: NEGATIVE
Ketones, ur: NEGATIVE mg/dL
Nitrite: NEGATIVE
PROTEIN: NEGATIVE mg/dL
Specific Gravity, Urine: 1.01 (ref 1.005–1.030)
UROBILINOGEN UA: 0.2 mg/dL (ref 0.0–1.0)
pH: 7 (ref 5.0–8.0)

## 2014-03-04 LAB — URINE MICROSCOPIC-ADD ON

## 2014-03-04 LAB — D-DIMER, QUANTITATIVE (NOT AT ARMC): D DIMER QUANT: 2.14 ug{FEU}/mL — AB (ref 0.00–0.48)

## 2014-03-04 MED ORDER — IOHEXOL 300 MG/ML  SOLN
50.0000 mL | Freq: Once | INTRAMUSCULAR | Status: AC | PRN
  Administered 2014-03-04: 50 mL via ORAL

## 2014-03-04 MED ORDER — HYDROMORPHONE HCL 1 MG/ML IJ SOLN
1.0000 mg | Freq: Once | INTRAMUSCULAR | Status: AC
  Administered 2014-03-04: 1 mg via INTRAVENOUS
  Filled 2014-03-04: qty 1

## 2014-03-04 MED ORDER — OXYCODONE-ACETAMINOPHEN 5-325 MG PO TABS: 1.0000 | ORAL_TABLET | Freq: Four times a day (QID) | ORAL | Status: DC | PRN

## 2014-03-04 MED ORDER — IOHEXOL 350 MG/ML SOLN
100.0000 mL | Freq: Once | INTRAVENOUS | Status: AC | PRN
  Administered 2014-03-04: 100 mL via INTRAVENOUS

## 2014-03-04 MED ORDER — HYDROMORPHONE HCL 1 MG/ML IJ SOLN
0.5000 mg | Freq: Once | INTRAMUSCULAR | Status: AC
  Administered 2014-03-04: 0.5 mg via INTRAVENOUS
  Filled 2014-03-04: qty 1

## 2014-03-04 MED ORDER — ONDANSETRON HCL 4 MG/2ML IJ SOLN
4.0000 mg | Freq: Once | INTRAMUSCULAR | Status: AC
  Administered 2014-03-04: 4 mg via INTRAVENOUS
  Filled 2014-03-04: qty 2

## 2014-03-04 NOTE — Discharge Instructions (Signed)
Follow up with your md as planned °

## 2014-03-04 NOTE — ED Notes (Signed)
Patient with no complaints at this time. Respirations even and unlabored. Skin warm/dry. Discharge instructions reviewed with patient at this time. Patient given opportunity to voice concerns/ask questions. IV removed per policy and band-aid applied to site. Patient discharged at this time and left Emergency Department via wheelchair.  

## 2014-03-04 NOTE — ED Notes (Addendum)
LUQ  and right lower back  pain times 3 days.  Found out past week he has bilateral  lung cancer.  Had BB placed to left chest for radiation.  Daughter with pt. And want his stomach checked.  Has upcoming PET scan.  And ct of the head.
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
## 2014-03-05 ENCOUNTER — Telehealth: Payer: Self-pay

## 2014-03-05 NOTE — Telephone Encounter (Signed)
Pt was referred by Dr. Luan Pulling for a colonoscopy if it is time. ( Pt has a hx of tubular adenoma) I saw pt was in ED on 03/04/2014. I called and spoke to his wife. She said he had been having some LUQ pain and was seen at the ED and they had a bad report and he will be having a whole body scan soon. She said to wait on this appt and they will contact us when they need to.  Sending an FYI to Neil Crouch, PA.

## 2014-03-06 ENCOUNTER — Ambulatory Visit (HOSPITAL_COMMUNITY)
Admission: RE | Admit: 2014-03-06 | Discharge: 2014-03-06 | Disposition: A | Discharge status: Home / Self Care | Payer: Medicare Other | Source: Ambulatory Visit | Attending: Pulmonary Disease | Admitting: Pulmonary Disease

## 2014-03-06 ENCOUNTER — Ambulatory Visit (HOSPITAL_COMMUNITY): Admission: RE | Admit: 2014-03-06 | Payer: Medicare Other | Source: Ambulatory Visit

## 2014-03-06 DIAGNOSIS — R079 Chest pain, unspecified: Secondary | ICD-10-CM | POA: Diagnosis not present

## 2014-03-06 NOTE — Telephone Encounter (Signed)
Noted  

## 2014-03-07 ENCOUNTER — Ambulatory Visit (HOSPITAL_COMMUNITY)
Admission: RE | Admit: 2014-03-07 | Discharge: 2014-03-07 | Disposition: A | Discharge status: Home / Self Care | Payer: Medicare Other | Source: Ambulatory Visit | Attending: Pulmonary Disease | Admitting: Pulmonary Disease

## 2014-03-07 ENCOUNTER — Other Ambulatory Visit (HOSPITAL_COMMUNITY): Payer: Self-pay | Admitting: Pulmonary Disease

## 2014-03-07 ENCOUNTER — Encounter (HOSPITAL_COMMUNITY): Payer: Self-pay

## 2014-03-07 DIAGNOSIS — I517 Cardiomegaly: Secondary | ICD-10-CM | POA: Diagnosis not present

## 2014-03-07 DIAGNOSIS — R918 Other nonspecific abnormal finding of lung field: Secondary | ICD-10-CM

## 2014-03-07 DIAGNOSIS — C787 Secondary malignant neoplasm of liver and intrahepatic bile duct: Secondary | ICD-10-CM | POA: Insufficient documentation

## 2014-03-07 DIAGNOSIS — C189 Malignant neoplasm of colon, unspecified: Secondary | ICD-10-CM

## 2014-03-07 DIAGNOSIS — C7951 Secondary malignant neoplasm of bone: Secondary | ICD-10-CM | POA: Insufficient documentation

## 2014-03-07 DIAGNOSIS — N400 Enlarged prostate without lower urinary tract symptoms: Secondary | ICD-10-CM | POA: Diagnosis not present

## 2014-03-07 DIAGNOSIS — Z85820 Personal history of malignant melanoma of skin: Secondary | ICD-10-CM | POA: Diagnosis not present

## 2014-03-07 DIAGNOSIS — I2510 Atherosclerotic heart disease of native coronary artery without angina pectoris: Secondary | ICD-10-CM | POA: Diagnosis not present

## 2014-03-07 DIAGNOSIS — N281 Cyst of kidney, acquired: Secondary | ICD-10-CM | POA: Insufficient documentation

## 2014-03-07 DIAGNOSIS — Z85038 Personal history of other malignant neoplasm of large intestine: Secondary | ICD-10-CM | POA: Diagnosis not present

## 2014-03-07 LAB — GLUCOSE, CAPILLARY: Glucose-Capillary: 101 mg/dL — ABNORMAL HIGH (ref 70–99)

## 2014-03-07 MED ORDER — FLUDEOXYGLUCOSE F - 18 (FDG) INJECTION
10.1000 | Freq: Once | INTRAVENOUS | Status: AC | PRN
  Administered 2014-03-07: 10.1 via INTRAVENOUS

## 2014-03-09 ENCOUNTER — Other Ambulatory Visit (HOSPITAL_COMMUNITY): Payer: Self-pay | Admitting: Pulmonary Disease

## 2014-03-09 DIAGNOSIS — C799 Secondary malignant neoplasm of unspecified site: Secondary | ICD-10-CM

## 2014-03-11 ENCOUNTER — Other Ambulatory Visit: Payer: Self-pay | Admitting: Radiology

## 2014-03-12 ENCOUNTER — Encounter (HOSPITAL_COMMUNITY): Payer: Medicare Other

## 2014-03-13 ENCOUNTER — Telehealth (HOSPITAL_COMMUNITY): Payer: Self-pay

## 2014-03-13 ENCOUNTER — Other Ambulatory Visit (HOSPITAL_COMMUNITY): Payer: Self-pay | Admitting: Hematology and Oncology

## 2014-03-13 ENCOUNTER — Other Ambulatory Visit: Payer: Self-pay | Admitting: Radiology

## 2014-03-13 MED ORDER — OXYCODONE HCL 5 MG PO TABS: ORAL_TABLET | ORAL | Status: DC

## 2014-03-13 NOTE — Telephone Encounter (Signed)
Call from daughter.  Her father was seen in ED recently and subsequently had scans that revealed metastatic disease including the liver.  Was given Hydrocodone with Acetaminophen 5-325 to be taken Q 6 hours and aleve 220mg  every 12 hours.  Will not have enough Hydrocodone to last until seen in the office tomorrow and pain is not completely relieved with current dosage.  Her dad is complaining  with pain in left upper abdominal quadrant, left chest and left arm. Wants to know if we can give him either a refill or something that may work a little better.  Is concerned about use of the acetaminophen with his liver metastases.

## 2014-03-14 ENCOUNTER — Encounter (HOSPITAL_COMMUNITY): Payer: Medicare Other

## 2014-03-14 ENCOUNTER — Encounter (HOSPITAL_COMMUNITY): Payer: Medicare Other | Attending: Hematology and Oncology

## 2014-03-14 ENCOUNTER — Encounter (HOSPITAL_COMMUNITY): Payer: Self-pay

## 2014-03-14 VITALS — BP 186/69 | HR 72 | Temp 98.1°F | Resp 20 | Wt 200.4 lb

## 2014-03-14 DIAGNOSIS — C439 Malignant melanoma of skin, unspecified: Secondary | ICD-10-CM | POA: Diagnosis present

## 2014-03-14 DIAGNOSIS — R918 Other nonspecific abnormal finding of lung field: Secondary | ICD-10-CM

## 2014-03-14 DIAGNOSIS — C189 Malignant neoplasm of colon, unspecified: Secondary | ICD-10-CM | POA: Insufficient documentation

## 2014-03-14 DIAGNOSIS — R05 Cough: Secondary | ICD-10-CM

## 2014-03-14 DIAGNOSIS — R079 Chest pain, unspecified: Secondary | ICD-10-CM

## 2014-03-14 DIAGNOSIS — C799 Secondary malignant neoplasm of unspecified site: Secondary | ICD-10-CM

## 2014-03-14 DIAGNOSIS — Z85038 Personal history of other malignant neoplasm of large intestine: Secondary | ICD-10-CM

## 2014-03-14 DIAGNOSIS — C787 Secondary malignant neoplasm of liver and intrahepatic bile duct: Secondary | ICD-10-CM

## 2014-03-14 DIAGNOSIS — C775 Secondary and unspecified malignant neoplasm of intrapelvic lymph nodes: Secondary | ICD-10-CM

## 2014-03-14 DIAGNOSIS — C7951 Secondary malignant neoplasm of bone: Secondary | ICD-10-CM

## 2014-03-14 DIAGNOSIS — I10 Essential (primary) hypertension: Secondary | ICD-10-CM

## 2014-03-14 DIAGNOSIS — C760 Malignant neoplasm of head, face and neck: Secondary | ICD-10-CM

## 2014-03-14 LAB — COMPREHENSIVE METABOLIC PANEL
ALT: 29 U/L (ref 0–53)
AST: 21 U/L (ref 0–37)
Albumin: 3.1 g/dL — ABNORMAL LOW (ref 3.5–5.2)
Alkaline Phosphatase: 130 U/L — ABNORMAL HIGH (ref 39–117)
Anion gap: 10 (ref 5–15)
BUN: 12 mg/dL (ref 6–23)
CALCIUM: 9.2 mg/dL (ref 8.4–10.5)
CO2: 28 mEq/L (ref 19–32)
Chloride: 103 mEq/L (ref 96–112)
Creatinine, Ser: 0.87 mg/dL (ref 0.50–1.35)
GFR calc non Af Amer: 78 mL/min — ABNORMAL LOW (ref >90)
GLUCOSE: 132 mg/dL — AB (ref 70–99)
Potassium: 4.2 mEq/L (ref 3.7–5.3)
Sodium: 141 mEq/L (ref 137–147)
TOTAL PROTEIN: 7.1 g/dL (ref 6.0–8.3)
Total Bilirubin: 0.2 mg/dL — ABNORMAL LOW (ref 0.3–1.2)

## 2014-03-14 LAB — CBC WITH DIFFERENTIAL/PLATELET
Basophils Absolute: 0 10*3/uL (ref 0.0–0.1)
Basophils Relative: 0 % (ref 0–1)
EOS ABS: 0.1 10*3/uL (ref 0.0–0.7)
EOS PCT: 1 % (ref 0–5)
HCT: 40.6 % (ref 39.0–52.0)
HEMOGLOBIN: 13.6 g/dL (ref 13.0–17.0)
LYMPHS ABS: 0.9 10*3/uL (ref 0.7–4.0)
Lymphocytes Relative: 11 % — ABNORMAL LOW (ref 12–46)
MCH: 28.4 pg (ref 26.0–34.0)
MCHC: 33.5 g/dL (ref 30.0–36.0)
MCV: 84.8 fL (ref 78.0–100.0)
MONOS PCT: 10 % (ref 3–12)
Monocytes Absolute: 0.8 10*3/uL (ref 0.1–1.0)
Neutro Abs: 6.1 10*3/uL (ref 1.7–7.7)
Neutrophils Relative %: 78 % — ABNORMAL HIGH (ref 43–77)
Platelets: 217 10*3/uL (ref 150–400)
RBC: 4.79 MIL/uL (ref 4.22–5.81)
RDW: 12.5 % (ref 11.5–15.5)
WBC: 7.8 10*3/uL (ref 4.0–10.5)

## 2014-03-14 MED ORDER — FUROSEMIDE 20 MG PO TABS: ORAL_TABLET | ORAL | Status: AC
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
## 2014-03-14 NOTE — Progress Notes (Signed)
Andre Pollard's reason for visit today is for labs as scheduled per MD orders.  Venipuncture performed with a 23 gauge butterfly needle to R Antecubital and left hand.  [name] tolerated procedure well and without incident; questions were answered and patient was discharged.

## 2014-03-14 NOTE — Patient Instructions (Signed)
[facility] Discharge Instructions  RECOMMENDATIONS MADE BY THE CONSULTANT AND ANY TEST RESULTS WILL BE SENT TO YOUR REFERRING PHYSICIAN.  EXAM FINDINGS BY THE PHYSICIAN TODAY AND SIGNS OR SYMPTOMS TO REPORT TO CLINIC OR PRIMARY PHYSICIAN: You saw Dr Barnet Glasgow today Please call the clinic if you have any questions or concerns.    MEDICATIONS PRESCRIBED: Lasix 20 mg (take 1 to 2 tablets in the morning) for leg edema is being sent to your pharmacy.  You can take Senokot S (generic) 2-3 tablets at bedtime for constipation. Take your pain medication as needed and you can take 2-3 pills at night as needed.   INSTRUCTIONS GIVEN AND DISCUSSED: You will have you biopsy on Friday.  Call Thursday before your appt of Friday to make sure all the information is back for your next appt on Friday.     Thank you for choosing [facility] to provide your oncology and hematology care.  To afford each patient quality time with our providers, please arrive at least 15 minutes before your scheduled appointment time.  With your help, our goal is to use those 15 minutes to complete the necessary work-up to ensure our physicians have the information they need to help with your evaluation and healthcare recommendations.    Effective January 1st, 2014, we ask that you re-schedule your appointment with our physicians should you arrive 10 or more minutes late for your appointment.  We strive to give you quality time with our providers, and arriving late affects you and other patients whose appointments are after yours.    Again, thank you for choosing [facility].  Our hope is that these requests will decrease the amount of time that you wait before being seen by our physicians.       _____________________________________________________________  Should you have questions after your visit to [facility], please contact our office at [phone] between the  hours of 8:30 a.m. and 5:00 p.m.  Voicemails left after 4:30 p.m. will not be returned until the following business day.  For prescription refill requests, have your pharmacy contact our office with your prescription refill request.

## 2014-03-15 LAB — CEA: CEA: 1.6 ng/mL (ref 0.0–5.0)

## 2014-03-16 ENCOUNTER — Other Ambulatory Visit (HOSPITAL_COMMUNITY): Payer: Self-pay | Admitting: Pulmonary Disease

## 2014-03-16 ENCOUNTER — Ambulatory Visit (HOSPITAL_COMMUNITY)
Admission: RE | Admit: 2014-03-16 | Discharge: 2014-03-16 | Disposition: A | Discharge status: Home / Self Care | Payer: Medicare Other | Source: Ambulatory Visit | Attending: Pulmonary Disease | Admitting: Pulmonary Disease

## 2014-03-16 ENCOUNTER — Encounter (HOSPITAL_COMMUNITY): Payer: Self-pay

## 2014-03-16 VITALS — BP 165/76 | HR 70 | Temp 98.2°F | Resp 16

## 2014-03-16 DIAGNOSIS — K6389 Other specified diseases of intestine: Secondary | ICD-10-CM | POA: Diagnosis not present

## 2014-03-16 DIAGNOSIS — Z85038 Personal history of other malignant neoplasm of large intestine: Secondary | ICD-10-CM | POA: Insufficient documentation

## 2014-03-16 DIAGNOSIS — Z9049 Acquired absence of other specified parts of digestive tract: Secondary | ICD-10-CM | POA: Insufficient documentation

## 2014-03-16 DIAGNOSIS — C787 Secondary malignant neoplasm of liver and intrahepatic bile duct: Secondary | ICD-10-CM | POA: Diagnosis not present

## 2014-03-16 DIAGNOSIS — C779 Secondary and unspecified malignant neoplasm of lymph node, unspecified: Secondary | ICD-10-CM | POA: Diagnosis not present

## 2014-03-16 DIAGNOSIS — Z923 Personal history of irradiation: Secondary | ICD-10-CM | POA: Insufficient documentation

## 2014-03-16 DIAGNOSIS — C799 Secondary malignant neoplasm of unspecified site: Secondary | ICD-10-CM

## 2014-03-16 DIAGNOSIS — Z79899 Other long term (current) drug therapy: Secondary | ICD-10-CM | POA: Insufficient documentation

## 2014-03-16 DIAGNOSIS — R918 Other nonspecific abnormal finding of lung field: Secondary | ICD-10-CM | POA: Diagnosis present

## 2014-03-16 DIAGNOSIS — N281 Cyst of kidney, acquired: Secondary | ICD-10-CM | POA: Diagnosis not present

## 2014-03-16 DIAGNOSIS — I2510 Atherosclerotic heart disease of native coronary artery without angina pectoris: Secondary | ICD-10-CM | POA: Insufficient documentation

## 2014-03-16 DIAGNOSIS — C189 Malignant neoplasm of colon, unspecified: Secondary | ICD-10-CM | POA: Insufficient documentation

## 2014-03-16 DIAGNOSIS — Z87891 Personal history of nicotine dependence: Secondary | ICD-10-CM | POA: Insufficient documentation

## 2014-03-16 DIAGNOSIS — C7800 Secondary malignant neoplasm of unspecified lung: Secondary | ICD-10-CM | POA: Diagnosis not present

## 2014-03-16 DIAGNOSIS — E780 Pure hypercholesterolemia: Secondary | ICD-10-CM | POA: Insufficient documentation

## 2014-03-16 DIAGNOSIS — C439 Malignant melanoma of skin, unspecified: Secondary | ICD-10-CM | POA: Diagnosis not present

## 2014-03-16 DIAGNOSIS — F329 Major depressive disorder, single episode, unspecified: Secondary | ICD-10-CM | POA: Insufficient documentation

## 2014-03-16 DIAGNOSIS — F0390 Unspecified dementia without behavioral disturbance: Secondary | ICD-10-CM | POA: Diagnosis not present

## 2014-03-16 DIAGNOSIS — Z7982 Long term (current) use of aspirin: Secondary | ICD-10-CM | POA: Diagnosis not present

## 2014-03-16 DIAGNOSIS — I10 Essential (primary) hypertension: Secondary | ICD-10-CM | POA: Insufficient documentation

## 2014-03-16 DIAGNOSIS — I700 Atherosclerosis of aorta: Secondary | ICD-10-CM | POA: Diagnosis not present

## 2014-03-16 DIAGNOSIS — Z87442 Personal history of urinary calculi: Secondary | ICD-10-CM | POA: Insufficient documentation

## 2014-03-16 DIAGNOSIS — I517 Cardiomegaly: Secondary | ICD-10-CM | POA: Diagnosis not present

## 2014-03-16 DIAGNOSIS — C7951 Secondary malignant neoplasm of bone: Secondary | ICD-10-CM | POA: Diagnosis not present

## 2014-03-16 DIAGNOSIS — Z85820 Personal history of malignant melanoma of skin: Secondary | ICD-10-CM | POA: Insufficient documentation

## 2014-03-16 LAB — CBC
HCT: 41.4 % (ref 39.0–52.0)
Hemoglobin: 13.5 g/dL (ref 13.0–17.0)
MCH: 27.6 pg (ref 26.0–34.0)
MCHC: 32.6 g/dL (ref 30.0–36.0)
MCV: 84.7 fL (ref 78.0–100.0)
PLATELETS: 166 10*3/uL (ref 150–400)
RBC: 4.89 MIL/uL (ref 4.22–5.81)
RDW: 12.8 % (ref 11.5–15.5)
WBC: 7.1 10*3/uL (ref 4.0–10.5)

## 2014-03-16 LAB — PROTIME-INR
INR: 1.02 (ref 0.00–1.49)
Prothrombin Time: 13.5 seconds (ref 11.6–15.2)

## 2014-03-16 LAB — APTT: APTT: 32 s (ref 24–37)

## 2014-03-16 MED ORDER — SODIUM CHLORIDE 0.9 % IV SOLN
INTRAVENOUS | Status: DC
Start: 2014-03-16 — End: 2014-03-17

## 2014-03-16 MED ORDER — FENTANYL CITRATE 0.05 MG/ML IJ SOLN
INTRAMUSCULAR | Status: AC
  Filled 2014-03-16: qty 2

## 2014-03-16 MED ORDER — LIDOCAINE HCL (PF) 1 % IJ SOLN
INTRAMUSCULAR | Status: DC
Start: 2014-03-16 — End: 2014-03-17
  Filled 2014-03-16: qty 10

## 2014-03-16 MED ORDER — FENTANYL CITRATE 0.05 MG/ML IJ SOLN
INTRAMUSCULAR | Status: AC | PRN
  Administered 2014-03-16: 50 ug via INTRAVENOUS

## 2014-03-16 MED ORDER — HYDROCODONE-ACETAMINOPHEN 5-325 MG PO TABS
1.0000 | ORAL_TABLET | ORAL | Status: DC | PRN
  Filled 2014-03-16: qty 2

## 2014-03-16 MED ORDER — MIDAZOLAM HCL 2 MG/2ML IJ SOLN
INTRAMUSCULAR | Status: AC | PRN
  Administered 2014-03-16: 1 mg via INTRAVENOUS

## 2014-03-16 MED ORDER — MIDAZOLAM HCL 2 MG/2ML IJ SOLN
INTRAMUSCULAR | Status: AC
  Filled 2014-03-16: qty 2

## 2014-03-16 NOTE — Discharge Instructions (Signed)
Biopsy Care After Refer to this sheet in the next few weeks. These instructions provide you with information on caring for yourself after your procedure. Your caregiver may also give you more specific instructions. Your treatment has been planned according to current medical practices, but problems sometimes occur. Call your caregiver if you have any problems or questions after your procedure. If you had a fine needle biopsy, you may have soreness at the biopsy site for 1 to 2 days. If you had an open biopsy, you may have soreness at the biopsy site for 3 to 4 days. HOME CARE INSTRUCTIONS   You may resume normal diet and activities as directed.  Change bandages (dressings) as directed. If your wound was closed with a skin glue (adhesive), it will wear off and begin to peel in 7 days.  Only take over-the-counter or prescription medicines for pain, discomfort, or fever as directed by your caregiver.  Ask your caregiver when you can bathe and get your wound wet. Shower in 24 hours, bath in 3-5 days after site heals over. SEEK IMMEDIATE MEDICAL CARE IF:   You have increased bleeding (more than a small spot) from the biopsy site.  You notice redness, swelling, or increasing pain at the biopsy site.  You have pus coming from the biopsy site.  You have a fever.  You notice a bad smell coming from the biopsy site or dressing.  You have a rash, have difficulty breathing, or have any allergic problems. MAKE SURE YOU:   Understand these instructions.  Will watch your condition.  Will get help right away if you are not doing well or get worse. Document Released: 10/24/2004 Document Revised: 06/29/2011 Document Reviewed: 10/02/2010 [facility] Patient Information 2015 [place], [place]. This information is not intended to replace advice given to you by your health care provider. Make sure you discuss any questions you have with your health care provider.

## 2014-03-16 NOTE — Procedures (Signed)
CT guided  Core biopsy of L ext iliac LN 18g x4 to surg path No complication No blood loss. See complete dictation in [facility].
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
## 2014-03-16 NOTE — Sedation Documentation (Signed)
Pt moved from [place] to CT to better visualize lymph  node that needs to be biopsied

## 2014-03-20 ENCOUNTER — Telehealth (HOSPITAL_COMMUNITY): Payer: Self-pay

## 2014-03-20 ENCOUNTER — Other Ambulatory Visit (HOSPITAL_COMMUNITY): Payer: Self-pay | Admitting: Hematology and Oncology

## 2014-03-20 ENCOUNTER — Other Ambulatory Visit (HOSPITAL_COMMUNITY): Payer: Medicare Other

## 2014-03-20 DIAGNOSIS — C787 Secondary malignant neoplasm of liver and intrahepatic bile duct: Secondary | ICD-10-CM

## 2014-03-20 NOTE — Telephone Encounter (Signed)
-----  Message from Louis Meckel sent at 03/20/2014  9:30 AM EST ----- I have changed Dr appt to 12/11.  The biopsy is in review and they will call the patient.  Can you call him and let him know of his results and new Dr appt date? Thanks   ----- Message -----    From: Farrel Gobble, MD    Sent: 03/20/2014   9:23 AM      To: Flonnie Hailstone Nance  Have ordered a CT-guided liver biopsy for 03/22/2014 since previous lymph node biopsy was negative for malignancy. Melanoma versus colon cancer metastases? If melanoma, tissue should be sent forBraf dictation. If colon cancer is found, send fo rBraf, K-ras, and N-ras mutations. When notifying patient, tell him that no cancer was found on the lymph node biopsy and therefore a liver biopsy is being scheduled instead. Change office appointment to 03/29/2014. Thank you.

## 2014-03-20 NOTE — Telephone Encounter (Signed)
Daughter notified and will try.  She will call Thursday with update.

## 2014-03-20 NOTE — Telephone Encounter (Signed)
Spoke with daughter [name] and explained reason for needing additional biopsy.  Wants to know if a fentanyl patch or some type of long acting pain medication can be used.  Doesn't want to take away the short acting but maybe reduce the amount that he needs to take, especially late at night.  Stated "dad is sleeping all of the time.  He's taking his oxycodone faithfully every 4 hours and his pain is under control.  The only issue is he's sleeping non-stop and is very groggy.  During the day is not as much an issue as nighttime.  I'm afraid he's going to get up and fall. We have a dry eraser board that they check off the medication when they give it.  At night we aren't sure they are doing this and are afraid he may be taking too much at night.  I'm concerned he's going to get up and fall due to his grogginess at night."

## 2014-03-20 NOTE — Telephone Encounter (Signed)
Per daughter, her mother is giving him 1 tablet every 3 hours.  This seems to work but he is very groggy and sleepy most of the time.

## 2014-03-20 NOTE — Telephone Encounter (Signed)
-----   Message from Farrel Gobble, MD sent at 03/20/2014  3:24 PM EST ----- Extendi interval to every 6 hours over the next 48 hours and call back. He is obviously accumulating medication and hopefully expanding the time interval between treatments will washout the amount of drug that is causing grogginess and not sacrifice analgesic efficacy.

## 2014-03-20 NOTE — Telephone Encounter (Signed)
-----   Message from Farrel Gobble, MD sent at 03/20/2014 11:44 AM EST ----- How many oxycodone is he taking each time. If he is taking 3 X 5mg  try taking 2 and if pain is still controlled we can switch to a long-acting product, either fentanyl patch or OxyContin and use the short-acting oxycodone for breakthrough pain. First we need to determine what his 24-hour analgesic requirement is that produces pain relief and at the same time does not cause excessive grogginess.

## 2014-03-22 ENCOUNTER — Ambulatory Visit (HOSPITAL_COMMUNITY): Payer: Medicare Other

## 2014-03-22 ENCOUNTER — Other Ambulatory Visit: Payer: Self-pay | Admitting: Radiology

## 2014-03-23 ENCOUNTER — Ambulatory Visit (HOSPITAL_COMMUNITY)
Admission: RE | Admit: 2014-03-23 | Discharge: 2014-03-23 | Disposition: A | Discharge status: Home / Self Care | Payer: Medicare Other | Source: Ambulatory Visit | Attending: Hematology and Oncology | Admitting: Hematology and Oncology

## 2014-03-23 ENCOUNTER — Encounter (HOSPITAL_COMMUNITY): Payer: Self-pay

## 2014-03-23 ENCOUNTER — Ambulatory Visit (HOSPITAL_COMMUNITY): Payer: Medicare Other

## 2014-03-23 DIAGNOSIS — R159 Full incontinence of feces: Secondary | ICD-10-CM | POA: Diagnosis not present

## 2014-03-23 DIAGNOSIS — Z87891 Personal history of nicotine dependence: Secondary | ICD-10-CM | POA: Diagnosis not present

## 2014-03-23 DIAGNOSIS — R972 Elevated prostate specific antigen [PSA]: Secondary | ICD-10-CM | POA: Diagnosis not present

## 2014-03-23 DIAGNOSIS — I10 Essential (primary) hypertension: Secondary | ICD-10-CM | POA: Diagnosis not present

## 2014-03-23 DIAGNOSIS — M1388 Other specified arthritis, other site: Secondary | ICD-10-CM | POA: Diagnosis not present

## 2014-03-23 DIAGNOSIS — C787 Secondary malignant neoplasm of liver and intrahepatic bile duct: Secondary | ICD-10-CM | POA: Diagnosis present

## 2014-03-23 DIAGNOSIS — Z87442 Personal history of urinary calculi: Secondary | ICD-10-CM | POA: Diagnosis not present

## 2014-03-23 DIAGNOSIS — F0390 Unspecified dementia without behavioral disturbance: Secondary | ICD-10-CM | POA: Diagnosis not present

## 2014-03-23 DIAGNOSIS — Z923 Personal history of irradiation: Secondary | ICD-10-CM | POA: Insufficient documentation

## 2014-03-23 DIAGNOSIS — Z85038 Personal history of other malignant neoplasm of large intestine: Secondary | ICD-10-CM | POA: Insufficient documentation

## 2014-03-23 DIAGNOSIS — C439 Malignant melanoma of skin, unspecified: Secondary | ICD-10-CM | POA: Diagnosis not present

## 2014-03-23 DIAGNOSIS — G709 Myoneural disorder, unspecified: Secondary | ICD-10-CM | POA: Diagnosis not present

## 2014-03-23 DIAGNOSIS — C7951 Secondary malignant neoplasm of bone: Secondary | ICD-10-CM | POA: Diagnosis present

## 2014-03-23 DIAGNOSIS — E780 Pure hypercholesterolemia: Secondary | ICD-10-CM | POA: Diagnosis not present

## 2014-03-23 DIAGNOSIS — C7800 Secondary malignant neoplasm of unspecified lung: Secondary | ICD-10-CM | POA: Insufficient documentation

## 2014-03-23 DIAGNOSIS — H9190 Unspecified hearing loss, unspecified ear: Secondary | ICD-10-CM | POA: Diagnosis not present

## 2014-03-23 DIAGNOSIS — F329 Major depressive disorder, single episode, unspecified: Secondary | ICD-10-CM | POA: Insufficient documentation

## 2014-03-23 LAB — APTT: APTT: 35 s (ref 24–37)

## 2014-03-23 LAB — CBC
HEMATOCRIT: 38.4 % — AB (ref 39.0–52.0)
HEMOGLOBIN: 12.8 g/dL — AB (ref 13.0–17.0)
MCH: 28.1 pg (ref 26.0–34.0)
MCHC: 33.3 g/dL (ref 30.0–36.0)
MCV: 84.2 fL (ref 78.0–100.0)
Platelets: 212 10*3/uL (ref 150–400)
RBC: 4.56 MIL/uL (ref 4.22–5.81)
RDW: 12.7 % (ref 11.5–15.5)
WBC: 9.9 10*3/uL (ref 4.0–10.5)

## 2014-03-23 LAB — PROTIME-INR
INR: 1.09 (ref 0.00–1.49)
Prothrombin Time: 14.3 seconds (ref 11.6–15.2)

## 2014-03-23 MED ORDER — SODIUM CHLORIDE 0.9 % IV SOLN
INTRAVENOUS | Status: DC
  Administered 2014-03-23: 11:00:00 via INTRAVENOUS
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
## 2014-03-23 NOTE — Discharge Instructions (Signed)
Conscious Sedation, Adult, Care After Refer to this sheet in the next few weeks. These instructions provide you with information on caring for yourself after your procedure. Your health care provider may also give you more specific instructions. Your treatment has been planned according to current medical practices, but problems sometimes occur. Call your health care provider if you have any problems or questions after your procedure. WHAT TO EXPECT AFTER THE PROCEDURE  After your procedure:  You may feel sleepy, clumsy, and have poor balance for several hours.  Vomiting may occur if you eat too soon after the procedure. HOME CARE INSTRUCTIONS  Do not participate in any activities where you could become injured for at least 24 hours. Do not:  Drive.  Swim.  Ride a bicycle.  Operate heavy machinery.  Cook.  Use power tools.  Climb ladders.  Work from a high place.  Do not make important decisions or sign legal documents until you are improved.  If you vomit, drink water, juice, or soup when you can drink without vomiting. Make sure you have little or no nausea before eating solid foods.  Only take over-the-counter or prescription medicines for pain, discomfort, or fever as directed by your health care provider.  Make sure you and your family fully understand everything about the medicines given to you, including what side effects may occur.  You should not drink alcohol, take sleeping pills, or take medicines that cause drowsiness for at least 24 hours.  If you smoke, do not smoke without supervision.  If you are feeling better, you may resume normal activities 24 hours after you were sedated.  Keep all appointments with your health care provider. SEEK MEDICAL CARE IF:  Your skin is pale or bluish in color.  You continue to feel nauseous or vomit.  Your pain is getting worse and is not helped by medicine.  You have bleeding or swelling.  You are still sleepy or  feeling clumsy after 24 hours. SEEK IMMEDIATE MEDICAL CARE IF:  You develop a rash.  You have difficulty breathing.  You develop any type of allergic problem.  You have a fever. MAKE SURE YOU:  Understand these instructions.  Will watch your condition.  Will get help right away if you are not doing well or get worse. Document Released: 01/25/2013 Document Reviewed: 01/25/2013 [facility] Patient Information 2015 [place], [place]. This information is not intended to replace advice given to you by your health care provider. Make sure you discuss any questions you have with your health care provider.  Bone Biopsy, Needle, Care After Read the instructions outlined below and refer to this sheet in the next few weeks. These discharge instructions provide you with general information on caring for yourself after you leave the hospital. Your caregiver may also give you specific instructions. While your treatment has been planned according to the most current medical practices available, unavoidable complications sometimes occur. If you have any problems or questions after discharge, call your caregiver. Finding out the results of your test Not all test results are available during your visit. If your test results are not back during the visit, make an appointment with your caregiver to find out the results. Do not assume everything is normal if you have not heard from your caregiver or the medical facility. It is important for you to follow up on all of your test results.  SEEK MEDICAL CARE IF:   You have redness, swelling, or increasing pain at the site of the biopsy.  You have pus coming from the biopsy site.  You have drainage from the biopsy site lasting longer than 1 day.  You notice a bad smell coming from the biopsy site or dressing.  You develop persistent nausea or vomiting. SEEK IMMEDIATE MEDICAL CARE IF:  You have a fever.  You develop a rash.  You have difficulty  breathing.  You develop any reaction or side effects to medicines given. Document Released: 10/24/2004 Document Revised: 01/25/2013 Document Reviewed: 09/11/2008 [facility] Patient Information 2015 [place], [place]. This information is not intended to replace advice given to you by your health care provider. Make sure you discuss any questions you have with your health care provider.  May remove bandaid and shower 24 hours post procedure.

## 2014-03-23 NOTE — Procedures (Signed)
Interventional Radiology Procedure Note  Procedure: CT guided biopsy of left transverse process, T7.  T7 with increased activity on PET.  Liver mass was not visualized by [place]. Complications: None Recommendations: - Bedrest supine x 1 hr  - Follow biopsy results  Signed,  Dulcy Fanny. Earleen Newport, DO

## 2014-03-26 ENCOUNTER — Telehealth (HOSPITAL_COMMUNITY): Payer: Self-pay

## 2014-03-26 NOTE — Telephone Encounter (Signed)
Call from [facility] "I think that dad is having high anxiety.  He's getting up in the middle of the night trying to find something to take.  I've taken his meds so he doesn't take too much.  He's having episodes of what he calls sudden chest pain.  There's no diaphoreses or arm discomfort at all and I took him to the ED for this before and they did an EKG that did not show anything.  Want to know if we can increase his zoloft dosage and do something about his constipation?  I think he may be having panic attacks"   Discussed with Andre Pane, PA-C and plan is to increase zoloft to 100mg  daily and double his daily stool softener and laxative that he is taking.  As far as his pain control, he tells his daughter that he is not getting relief.  Areas that he complains about is his left lower chest and left upper abdomen.  He's taking 1 oxycodone every 4 hours during the day and 2 pills at 8 pm and 1 - 2 pills again during the night around 12 or 4 am.  Daughter thinks he might benefit from long acting pain med and to use the oxycodone for break through pain.

## 2014-03-27 ENCOUNTER — Other Ambulatory Visit (HOSPITAL_COMMUNITY): Payer: Self-pay | Admitting: Hematology and Oncology

## 2014-03-27 ENCOUNTER — Telehealth (HOSPITAL_COMMUNITY): Payer: Self-pay

## 2014-03-27 MED ORDER — OXYCODONE HCL 5 MG PO TABS: ORAL_TABLET | ORAL | Status: DC

## 2014-03-27 MED ORDER — OXYCODONE HCL ER 20 MG PO T12A: 20.0000 mg | EXTENDED_RELEASE_TABLET | Freq: Two times a day (BID) | ORAL | Status: DC

## 2014-03-27 NOTE — Telephone Encounter (Signed)
-----   Message from Farrel Gobble, MD sent at 03/27/2014  6:43 AM EST ----- Have ordered OxyContin 20 mg every 12 hours and use oxycodone intermittently for breakthrough pain.

## 2014-03-27 NOTE — Telephone Encounter (Signed)
[place] notified.  She will be by to pick up prescription today.  Will be running out of oxycodone Thursday and would like a refill if possible.

## 2014-03-29 ENCOUNTER — Other Ambulatory Visit (HOSPITAL_COMMUNITY): Payer: Self-pay | Admitting: Hematology and Oncology

## 2014-03-29 ENCOUNTER — Other Ambulatory Visit (HOSPITAL_COMMUNITY): Payer: Self-pay | Admitting: *Deleted

## 2014-03-29 DIAGNOSIS — C439 Malignant melanoma of skin, unspecified: Secondary | ICD-10-CM

## 2014-03-29 MED ORDER — FENTANYL 25 MCG/HR TD PT72: 25.0000 ug | MEDICATED_PATCH | TRANSDERMAL | Status: DC

## 2014-03-30 ENCOUNTER — Encounter (HOSPITAL_COMMUNITY): Payer: Self-pay

## 2014-03-30 ENCOUNTER — Encounter (HOSPITAL_COMMUNITY): Payer: Medicare Other | Attending: Hematology and Oncology

## 2014-03-30 VITALS — BP 136/61 | HR 86 | Temp 98.8°F | Wt 194.0 lb

## 2014-03-30 DIAGNOSIS — C439 Malignant melanoma of skin, unspecified: Secondary | ICD-10-CM | POA: Insufficient documentation

## 2014-03-30 DIAGNOSIS — C189 Malignant neoplasm of colon, unspecified: Secondary | ICD-10-CM | POA: Insufficient documentation

## 2014-03-30 DIAGNOSIS — C799 Secondary malignant neoplasm of unspecified site: Secondary | ICD-10-CM

## 2014-03-30 DIAGNOSIS — Z85038 Personal history of other malignant neoplasm of large intestine: Secondary | ICD-10-CM

## 2014-03-30 NOTE — Patient Instructions (Signed)
[place] Discharge Instructions  RECOMMENDATIONS MADE BY THE CONSULTANT AND ANY TEST RESULTS WILL BE SENT TO YOUR REFERRING PHYSICIAN.  Fentanyl 25 mcg patch - every 72 hours Oxycodone 1-2 tablets every 4-6 hours as needed for breakthrough pain Lasix 40mg  (20mg  tab x 2) every morning Follow up in 2 weeks with office visit (we may contact you next week if results are back sooner) [facility] may follow your care at our clinic by [address] in [place]  Thank you for choosing [facility] to provide your oncology and hematology care.  To afford each patient quality time with our providers, please arrive at least 15 minutes before your scheduled appointment time.  With your help, our goal is to use those 15 minutes to complete the necessary work-up to ensure our physicians have the information they need to help with your evaluation and healthcare recommendations.    Effective January 1st, 2014, we ask that you re-schedule your appointment with our physicians should you arrive 10 or more minutes late for your appointment.  We strive to give you quality time with our providers, and arriving late affects you and other patients whose appointments are after yours.    Again, thank you for choosing [facility].  Our hope is that these requests will decrease the amount of time that you wait before being seen by our physicians.       _____________________________________________________________  Should you have questions after your visit to [facility], please contact our office at [phone] between the hours of 8:30 a.m. and 4:30 p.m.  Voicemails left after 4:30 p.m. will not be returned until the following business day.  For prescription refill requests, have your pharmacy contact our office with your prescription refill request.    _______________________________________________________________  We hope that we have given you very good  care.  You may receive a patient satisfaction survey in the mail, please complete it and return it as soon as possible.  We value your feedback!  _______________________________________________________________  Have you asked about our STAR program?  STAR stands for Survivorship Training and Rehabilitation, and this is a nationally recognized cancer care program that focuses on survivorship and rehabilitation.  Cancer and cancer treatments may cause problems, such as, pain, making you feel tired and keeping you from doing the things that you need or want to do. Cancer rehabilitation can help. Our goal is to reduce these troubling effects and help you have the best quality of life possible.  You may receive a survey from a nurse that asks questions about your current state of health.  Based on the survey results, all eligible patients will be referred to the [facility] program for an evaluation so we can better serve you!  A frequently asked questions sheet is available upon request.
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
## 2014-04-04 ENCOUNTER — Encounter (HOSPITAL_COMMUNITY): Payer: Self-pay

## 2014-04-05 ENCOUNTER — Other Ambulatory Visit (HOSPITAL_COMMUNITY): Payer: Self-pay | Admitting: Hematology and Oncology

## 2014-04-05 DIAGNOSIS — C439 Malignant melanoma of skin, unspecified: Secondary | ICD-10-CM

## 2014-04-10 ENCOUNTER — Other Ambulatory Visit (HOSPITAL_COMMUNITY): Payer: Self-pay | Admitting: Hematology and Oncology

## 2014-04-10 ENCOUNTER — Emergency Department (HOSPITAL_COMMUNITY)
Admission: EM | Admit: 2014-04-10 | Discharge: 2014-04-11 | Disposition: A | Discharge status: Home / Self Care | Payer: Medicare Other | Attending: Emergency Medicine | Admitting: Emergency Medicine

## 2014-04-10 ENCOUNTER — Emergency Department (HOSPITAL_COMMUNITY): Payer: Medicare Other

## 2014-04-10 ENCOUNTER — Ambulatory Visit (HOSPITAL_COMMUNITY): Payer: Medicare Other | Attending: Cardiology

## 2014-04-10 ENCOUNTER — Other Ambulatory Visit: Payer: Self-pay

## 2014-04-10 ENCOUNTER — Encounter (HOSPITAL_COMMUNITY): Payer: Self-pay

## 2014-04-10 DIAGNOSIS — Y998 Other external cause status: Secondary | ICD-10-CM | POA: Diagnosis not present

## 2014-04-10 DIAGNOSIS — M1990 Unspecified osteoarthritis, unspecified site: Secondary | ICD-10-CM | POA: Insufficient documentation

## 2014-04-10 DIAGNOSIS — C189 Malignant neoplasm of colon, unspecified: Secondary | ICD-10-CM | POA: Insufficient documentation

## 2014-04-10 DIAGNOSIS — Z87442 Personal history of urinary calculi: Secondary | ICD-10-CM | POA: Diagnosis not present

## 2014-04-10 DIAGNOSIS — E780 Pure hypercholesterolemia: Secondary | ICD-10-CM | POA: Insufficient documentation

## 2014-04-10 DIAGNOSIS — I10 Essential (primary) hypertension: Secondary | ICD-10-CM | POA: Diagnosis not present

## 2014-04-10 DIAGNOSIS — C439 Malignant melanoma of skin, unspecified: Secondary | ICD-10-CM | POA: Insufficient documentation

## 2014-04-10 DIAGNOSIS — Z8669 Personal history of other diseases of the nervous system and sense organs: Secondary | ICD-10-CM | POA: Insufficient documentation

## 2014-04-10 DIAGNOSIS — Z923 Personal history of irradiation: Secondary | ICD-10-CM | POA: Insufficient documentation

## 2014-04-10 DIAGNOSIS — F329 Major depressive disorder, single episode, unspecified: Secondary | ICD-10-CM | POA: Diagnosis not present

## 2014-04-10 DIAGNOSIS — E785 Hyperlipidemia, unspecified: Secondary | ICD-10-CM | POA: Insufficient documentation

## 2014-04-10 DIAGNOSIS — Z79899 Other long term (current) drug therapy: Secondary | ICD-10-CM | POA: Diagnosis not present

## 2014-04-10 DIAGNOSIS — C7931 Secondary malignant neoplasm of brain: Secondary | ICD-10-CM | POA: Insufficient documentation

## 2014-04-10 DIAGNOSIS — Z791 Long term (current) use of non-steroidal anti-inflammatories (NSAID): Secondary | ICD-10-CM | POA: Diagnosis not present

## 2014-04-10 DIAGNOSIS — I429 Cardiomyopathy, unspecified: Secondary | ICD-10-CM

## 2014-04-10 DIAGNOSIS — Z87891 Personal history of nicotine dependence: Secondary | ICD-10-CM | POA: Insufficient documentation

## 2014-04-10 DIAGNOSIS — Z85828 Personal history of other malignant neoplasm of skin: Secondary | ICD-10-CM | POA: Diagnosis not present

## 2014-04-10 DIAGNOSIS — S3991XA Unspecified injury of abdomen, initial encounter: Secondary | ICD-10-CM | POA: Diagnosis not present

## 2014-04-10 DIAGNOSIS — R55 Syncope and collapse: Secondary | ICD-10-CM | POA: Diagnosis present

## 2014-04-10 DIAGNOSIS — Y9289 Other specified places as the place of occurrence of the external cause: Secondary | ICD-10-CM | POA: Diagnosis not present

## 2014-04-10 DIAGNOSIS — R270 Ataxia, unspecified: Secondary | ICD-10-CM | POA: Diagnosis not present

## 2014-04-10 DIAGNOSIS — F0390 Unspecified dementia without behavioral disturbance: Secondary | ICD-10-CM | POA: Insufficient documentation

## 2014-04-10 DIAGNOSIS — Z9889 Other specified postprocedural states: Secondary | ICD-10-CM | POA: Diagnosis not present

## 2014-04-10 DIAGNOSIS — Z85038 Personal history of other malignant neoplasm of large intestine: Secondary | ICD-10-CM | POA: Diagnosis not present

## 2014-04-10 DIAGNOSIS — W1839XA Other fall on same level, initial encounter: Secondary | ICD-10-CM | POA: Insufficient documentation

## 2014-04-10 DIAGNOSIS — R451 Restlessness and agitation: Secondary | ICD-10-CM

## 2014-04-10 DIAGNOSIS — Y9389 Activity, other specified: Secondary | ICD-10-CM | POA: Diagnosis not present

## 2014-04-10 DIAGNOSIS — C799 Secondary malignant neoplasm of unspecified site: Secondary | ICD-10-CM

## 2014-04-10 DIAGNOSIS — Z7982 Long term (current) use of aspirin: Secondary | ICD-10-CM | POA: Insufficient documentation

## 2014-04-10 LAB — CBC WITH DIFFERENTIAL/PLATELET
BASOS ABS: 0 10*3/uL (ref 0.0–0.1)
BASOS PCT: 0 % (ref 0–1)
EOS ABS: 0 10*3/uL (ref 0.0–0.7)
Eosinophils Relative: 0 % (ref 0–5)
HCT: 40.1 % (ref 39.0–52.0)
Hemoglobin: 13.2 g/dL (ref 13.0–17.0)
Lymphocytes Relative: 8 % — ABNORMAL LOW (ref 12–46)
Lymphs Abs: 0.9 10*3/uL (ref 0.7–4.0)
MCH: 28.1 pg (ref 26.0–34.0)
MCHC: 32.9 g/dL (ref 30.0–36.0)
MCV: 85.5 fL (ref 78.0–100.0)
Monocytes Absolute: 0.9 10*3/uL (ref 0.1–1.0)
Monocytes Relative: 8 % (ref 3–12)
NEUTROS PCT: 84 % — AB (ref 43–77)
Neutro Abs: 8.8 10*3/uL — ABNORMAL HIGH (ref 1.7–7.7)
Platelets: 191 10*3/uL (ref 150–400)
RBC: 4.69 MIL/uL (ref 4.22–5.81)
RDW: 12.8 % (ref 11.5–15.5)
WBC: 10.6 10*3/uL — ABNORMAL HIGH (ref 4.0–10.5)

## 2014-04-10 LAB — COMPREHENSIVE METABOLIC PANEL
ALBUMIN: 3 g/dL — AB (ref 3.5–5.2)
ALT: 13 U/L (ref 0–53)
ANION GAP: 7 (ref 5–15)
AST: 14 U/L (ref 0–37)
Alkaline Phosphatase: 110 U/L (ref 39–117)
BILIRUBIN TOTAL: 0.4 mg/dL (ref 0.3–1.2)
BUN: 13 mg/dL (ref 6–23)
CHLORIDE: 98 meq/L (ref 96–112)
CO2: 32 mmol/L (ref 19–32)
CREATININE: 0.81 mg/dL (ref 0.50–1.35)
Calcium: 8.7 mg/dL (ref 8.4–10.5)
GFR calc Af Amer: >90 mL/min (ref >90)
GFR calc non Af Amer: 81 mL/min — ABNORMAL LOW (ref >90)
Glucose, Bld: 104 mg/dL — ABNORMAL HIGH (ref 70–99)
Potassium: 4.2 mmol/L (ref 3.5–5.1)
Sodium: 137 mmol/L (ref 135–145)
TOTAL PROTEIN: 6.7 g/dL (ref 6.0–8.3)

## 2014-04-10 LAB — URINALYSIS, ROUTINE W REFLEX MICROSCOPIC
Bilirubin Urine: NEGATIVE
GLUCOSE, UA: NEGATIVE mg/dL
Hgb urine dipstick: NEGATIVE
KETONES UR: NEGATIVE mg/dL
NITRITE: POSITIVE — AB
PH: 7 (ref 5.0–8.0)
Protein, ur: NEGATIVE mg/dL
Specific Gravity, Urine: 1.01 (ref 1.005–1.030)
Urobilinogen, UA: 0.2 mg/dL (ref 0.0–1.0)

## 2014-04-10 LAB — URINE MICROSCOPIC-ADD ON

## 2014-04-10 MED ORDER — HYDROMORPHONE HCL 1 MG/ML IJ SOLN
0.5000 mg | Freq: Once | INTRAMUSCULAR | Status: AC
  Administered 2014-04-10: 0.5 mg via INTRAVENOUS
  Filled 2014-04-10: qty 1

## 2014-04-10 MED ORDER — GADOBENATE DIMEGLUMINE 529 MG/ML IV SOLN
18.0000 mL | Freq: Once | INTRAVENOUS | Status: AC | PRN
  Administered 2014-04-10: 18 mL via INTRAVENOUS

## 2014-04-10 MED ORDER — LORAZEPAM 2 MG/ML IJ SOLN
0.5000 mg | Freq: Once | INTRAMUSCULAR | Status: AC
  Administered 2014-04-10: 0.5 mg via INTRAVENOUS
  Filled 2014-04-10: qty 1

## 2014-04-10 MED ORDER — MORPHINE SULFATE 2 MG/ML IJ SOLN
INTRAMUSCULAR | Status: AC
  Filled 2014-04-10: qty 1

## 2014-04-10 NOTE — Discharge Instructions (Signed)
Call your oncologist in the morning, to report the right cerebellar cancer results on MRI. Follow up with your primary care doctor. Return here as needed for problems.     Metastatic Brain Tumor A metastatic brain tumor is a growth in your brain. It is made of cancer cells from another part of your body that traveled to your brain through your bloodstream, along the nerves of your brain (cranial nerves), or through the opening at the base of your skull.  CAUSES  The most common cause of a metastatic brain tumor in men is lung cancer. In women, it is breast cancer. Other common causes include:  Unknown types of cancers.  Skin cancer (melanoma).  Colon cancer.  Kidney cancer. SIGNS AND SYMPTOMS  Most signs and symptoms of metastatic brain tumors are caused by increased pressure inside your brain. A headache is often the first symptom. Eventually, almost everyone with a metastatic brain tumor has a headache. Other signs and symptoms include:  Vomiting.  Weakness or fatigue.  Seizures.  Sensory problems, like tingling or numbness.  Problems walking.  Clumsiness.  Emotional changes.  Personality changes.  Changes in speech or vision. DIAGNOSIS  Your health care provider can diagnose a metastatic brain tumor from your medical history and physical exam. You may also have some additional tests, including:  A nervous system function test (neurologic exam).  Imaging studies of your brain, such as an MRI and CT scan.  Having a small piece of tissue removed (biopsy) from your brain to be checked under a microscope. TREATMENT  Treatment depends on the type of cancer you have and your overall health. It also depends on the size of your tumor and the number of brain tumors you have. The most common treatments include:  Medicines to control pain, nausea, and seizures.  Cancer-killing drugs (chemotherapy).  An X-ray treatment called radiation therapy to kill brain tumor  cells.  A type of radiation therapy that is targeted to exact locations in the brain (stereotactic radiotherapy).  Surgery to remove brain tumors. HOME CARE INSTRUCTIONS  Only take medicines as directed by your health care provider.  Do not drive if:  You are taking strong pain medicines.  Your vision or thinking is not clear.  Take two 10-minute walks every day if you are able. Exercising is a good way to relieve stress. It can also help you sleep.  Be sure to get enough calories and protein in your diet.  If you have a poor appetite or feel nauseous, eat small meals often.  Supplement your diet with protein shakes or milk shakes.  It is common to have anxiety and depression when you are coping with cancer.  Talk to friends and loved ones about your feelings.  Have a good support system at home. SEEK MEDICAL CARE IF:  You have chills or fever.  Your medicine is not controlling your symptoms.  Your symptoms get worse or you develop new symptoms.  You are having trouble caring for yourself or being cared for at home.  You are struggling with anxiety or depression. SEEK IMMEDIATE MEDICAL CARE IF:  You cannot stop vomiting.  You cannot keep down any foods or fluids.  You have sudden changes in speech or vision.  You have a seizure.  You can no longer take care of yourself at home. Document Released: 01/01/2004 Document Revised: 08/21/2013 Document Reviewed: 04/03/2008 [facility] Patient Information 2015 [place], [place]. This information is not intended to replace advice given to you by  your health care provider. Make sure you discuss any questions you have with your health care provider. ° °
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
## 2014-04-10 NOTE — ED Notes (Signed)
Pt void during MRI, wet, changed, clean and dry

## 2014-04-10 NOTE — ED Notes (Signed)
Patient placed on continuous cardiac monitoring, continuous pulse 0x monitoring 

## 2014-04-10 NOTE — ED Notes (Signed)
MD at the bedside  

## 2014-04-10 NOTE — Progress Notes (Signed)
2D Echo completed. 04/10/2014 

## 2014-04-10 NOTE — ED Notes (Signed)
Pt's daughter reports that pt has had 2 syncopal episodes today.  Reports confusion.  Pt has history of melanoma and reports has spread to liver, lungs, bone, and he is scheduled for MRI tomorrow to assess for brain lesions.  Reports has not started treatment yet.  Reports had 2d echo today to assess for CHF.

## 2014-04-10 NOTE — ED Notes (Signed)
Verbal order to give morphine, pulled, per family pt can not take morphine, verbal order to give Dilaudid 0.5mg  and ok for family to give pt home medication, oxycodone 5mg 

## 2014-04-11 ENCOUNTER — Ambulatory Visit (HOSPITAL_COMMUNITY): Payer: Medicare Other

## 2014-04-11 ENCOUNTER — Other Ambulatory Visit (HOSPITAL_COMMUNITY): Payer: Medicare Other

## 2014-04-11 ENCOUNTER — Encounter (HOSPITAL_COMMUNITY): Payer: Self-pay

## 2014-04-11 ENCOUNTER — Telehealth (HOSPITAL_COMMUNITY): Payer: Self-pay

## 2014-04-11 ENCOUNTER — Other Ambulatory Visit (HOSPITAL_COMMUNITY): Payer: Self-pay

## 2014-04-11 MED ORDER — DEXAMETHASONE 4 MG PO TABS: 4.0000 mg | ORAL_TABLET | Freq: Two times a day (BID) | ORAL | Status: DC

## 2014-04-11 NOTE — Telephone Encounter (Signed)
Call from Dr. Elmon Kirschner (Melanoma Oncology @ [facility] - has be seen there before) with recommendations to give Andre Pollard as treatment.  Recommends that prior authorization be started now in order to decrease wait time for treatment.  Can be reached via her cell phone @ [phone] if you want to discuss.

## 2014-04-11 NOTE — Telephone Encounter (Signed)
Call from daughter, patient became weak and began having balance problems and fell at home and was brought to ED last night.  MRI of brain was performed and was told her father had metastatic lesion to brain.  Call was made by daughter to [facility] and was able to speak to nurse navigator, Coral Ceo.  Was told that Dr. Truman Hayward was not available and recommended that the new findings be handled by our office if at all possible.

## 2014-04-11 NOTE — Telephone Encounter (Signed)
Daughter notified that Dr. Whitney Muse has talked with Dr. Sondra Come and Radiation Oncology will be calling them with an appointment.

## 2014-04-11 NOTE — Progress Notes (Signed)
Location/Histology of Brain Tumor: MRI from 04/10/14 showed "12 x 19 x 17 mm enhancing lesion within the medial right cerebellar hemisphere"   Patient presented to the Emergency Department on 04/10/14 complaining of 2 episodes of LOC with associated confusion that occurred earlier in the patient's home.  Patient's daughter said two nights ago he fell at home.  Past or anticipated interventions, if any, per neurosurgery: no  Past or anticipated interventions, if any, per medical oncology: yes sees Soil scientist at [facility].  He will also be seeing Dr. Imagene Gurney at [facility] on Tuesday.  Dose of Decadron, if applicable: 8 mg twice daily.  He started yesterday morning.  Recent neurologic symptoms, if any:   Seizures: no  Headaches: has right earaches  Nausea: no  Dizziness/ataxia: yes patient states he is "crazy dizzy"  Difficulty with hand coordination: he reports he has noticed that his fingers are slow.  Focal numbness/weakness: legs and arms are weak.    Visual deficits/changes: yes states he can't read as well as he used to.  Confusion/Memory deficits: yes - his daughters state that he had extreme confusion on Tuesday.  He is better now.  Painful bone metastases at present, if any: yes thoracic spine  SAFETY ISSUES:  Prior radiation? 05/09/2013-05/22/2013 30 Gy to right cheek   Pacemaker/ICD? no  Possible current pregnancy? no  Is the patient on methotrexate? no  Additional Complaints / other details: Patient is here with his daughters today.  He reports pain in his left upper abdomen.  He is taking oxcodone 5 mg about twice a day and is using a 25 mcg fentanyl patch.

## 2014-04-11 NOTE — Telephone Encounter (Signed)
Daughter notified that after discussion with Dr. Whitney Muse , decadron was e-scribed to Andre Pollard and patient is to take 8 mg twice daily and that referral has been made to Radiation Oncology.  Verbalized understanding of instructions.

## 2014-04-12 ENCOUNTER — Other Ambulatory Visit: Payer: Self-pay | Admitting: Radiation Therapy

## 2014-04-12 ENCOUNTER — Ambulatory Visit (HOSPITAL_COMMUNITY): Payer: Medicare Other

## 2014-04-12 ENCOUNTER — Ambulatory Visit
Admission: RE | Admit: 2014-04-12 | Discharge: 2014-04-12 | Disposition: A | Discharge status: Home / Self Care | Payer: Medicare Other | Source: Ambulatory Visit | Attending: Radiation Oncology | Admitting: Radiation Oncology

## 2014-04-12 ENCOUNTER — Telehealth: Payer: Self-pay | Admitting: Oncology

## 2014-04-12 ENCOUNTER — Ambulatory Visit: Payer: Medicare Other | Admitting: Radiation Oncology

## 2014-04-12 ENCOUNTER — Encounter: Payer: Self-pay | Admitting: Radiation Oncology

## 2014-04-12 ENCOUNTER — Ambulatory Visit: Admission: RE | Admit: 2014-04-12 | Payer: Medicare Other | Source: Ambulatory Visit | Admitting: Radiation Oncology

## 2014-04-12 VITALS — BP 145/67 | HR 82 | Temp 97.6°F | Resp 20 | Ht 69.0 in | Wt 183.8 lb

## 2014-04-12 DIAGNOSIS — Z923 Personal history of irradiation: Secondary | ICD-10-CM | POA: Insufficient documentation

## 2014-04-12 DIAGNOSIS — C7931 Secondary malignant neoplasm of brain: Secondary | ICD-10-CM | POA: Diagnosis not present

## 2014-04-12 DIAGNOSIS — G9520 Unspecified cord compression: Secondary | ICD-10-CM | POA: Diagnosis not present

## 2014-04-12 DIAGNOSIS — Z9049 Acquired absence of other specified parts of digestive tract: Secondary | ICD-10-CM | POA: Diagnosis not present

## 2014-04-12 DIAGNOSIS — Z87891 Personal history of nicotine dependence: Secondary | ICD-10-CM | POA: Diagnosis not present

## 2014-04-12 DIAGNOSIS — I10 Essential (primary) hypertension: Secondary | ICD-10-CM | POA: Diagnosis not present

## 2014-04-12 DIAGNOSIS — Z510 Encounter for antineoplastic radiation therapy: Secondary | ICD-10-CM | POA: Insufficient documentation

## 2014-04-12 DIAGNOSIS — F0390 Unspecified dementia without behavioral disturbance: Secondary | ICD-10-CM | POA: Insufficient documentation

## 2014-04-12 DIAGNOSIS — Z7952 Long term (current) use of systemic steroids: Secondary | ICD-10-CM | POA: Diagnosis not present

## 2014-04-12 DIAGNOSIS — Z7982 Long term (current) use of aspirin: Secondary | ICD-10-CM | POA: Insufficient documentation

## 2014-04-12 DIAGNOSIS — C7949 Secondary malignant neoplasm of other parts of nervous system: Principal | ICD-10-CM

## 2014-04-12 DIAGNOSIS — E780 Pure hypercholesterolemia: Secondary | ICD-10-CM | POA: Insufficient documentation

## 2014-04-12 DIAGNOSIS — Z85038 Personal history of other malignant neoplasm of large intestine: Secondary | ICD-10-CM | POA: Diagnosis not present

## 2014-04-12 DIAGNOSIS — R1012 Left upper quadrant pain: Secondary | ICD-10-CM | POA: Diagnosis not present

## 2014-04-12 DIAGNOSIS — C439 Malignant melanoma of skin, unspecified: Secondary | ICD-10-CM | POA: Diagnosis not present

## 2014-04-12 DIAGNOSIS — Z791 Long term (current) use of non-steroidal anti-inflammatories (NSAID): Secondary | ICD-10-CM | POA: Diagnosis not present

## 2014-04-12 NOTE — Progress Notes (Signed)
Please see the Nurse Progress Note in the MD Initial Consult Encounter for this patient. 
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
## 2014-04-12 NOTE — Telephone Encounter (Signed)
Called in to [facility] per Dr. Sondra Come for LORazepam (ATIVAN) 1 MG tablet Take 1 tablet (1 mg) 30 minutes prior to procedure. Disp. 10 talbets. 0 refills.

## 2014-04-12 NOTE — Progress Notes (Signed)
NOTE:  Additional providers patient would like added to his records - [employer] and Trixie Rude [facility])

## 2014-04-13 LAB — URINE CULTURE

## 2014-04-14 ENCOUNTER — Telehealth: Payer: Self-pay | Admitting: Emergency Medicine

## 2014-04-14 NOTE — Progress Notes (Signed)
ED Antimicrobial Stewardship Positive Culture Follow Up   Andre Pollard is an 80 y.o. male who presented to [facility] on 04/10/2014 with a chief complaint of syncope  Chief Complaint  Patient presents with  . Loss of Consciousness    Recent Results (from the past 720 hour(s))  Urine culture     Status: None   Collection Time: 04/10/14  6:50 PM  Result Value Ref Range Status   Specimen Description URINE, CLEAN CATCH  Final   Special Requests NONE  Final   Culture  Setup Time   Final    04/11/2014 14:11 Performed at [place]   Final    >=100,000 COLONIES/ML Performed at [facility]    Culture   Final    [place] Performed at [facility]    Report Status 04/13/2014 FINAL  Final   Organism ID, Bacteria KLEBSIELLA PNEUMONIAE  Final      Susceptibility   Klebsiella pneumoniae - MIC*    AMPICILLIN RESISTANT      CEFAZOLIN <=4 SENSITIVE Sensitive     CEFTRIAXONE <=1 SENSITIVE Sensitive     CIPROFLOXACIN <=0.25 SENSITIVE Sensitive     GENTAMICIN <=1 SENSITIVE Sensitive     LEVOFLOXACIN <=0.12 SENSITIVE Sensitive     NITROFURANTOIN <=16 SENSITIVE Sensitive     TOBRAMYCIN <=1 SENSITIVE Sensitive     TRIMETH/SULFA <=20 SENSITIVE Sensitive     PIP/TAZO <=4 SENSITIVE Sensitive     * KLEBSIELLA PNEUMONIAE    [x]  Patient discharged originally without antimicrobial agent and treatment is now indicated  [name] with known metastatic melanoma who presented to the ED on 12/22 with syncopal episodes x 2. Work-up revealed a new metastatic lesion to the brain. Work-up also showed fevers, slightly elevated WBC, and positive UA which grew out Klebsiella PNA - would recommend treating.  New antibiotic prescription: Keflex 500 mg bid x 7 days  ED Provider: Glendell Docker, NP  Lawson Radar 04/14/2014, 1:58 PM Infectious Diseases Pharmacist Phone# [phone]

## 2014-04-15 ENCOUNTER — Telehealth (HOSPITAL_COMMUNITY): Payer: Self-pay

## 2014-04-15 NOTE — ED Notes (Signed)
daughter (caregiver) returned call. informed of lab results. Per V.Pickering Keflex 500mg  bid x 7 days called to [facility] in [place],

## 2014-04-16 ENCOUNTER — Ambulatory Visit
Admission: RE | Admit: 2014-04-16 | Discharge: 2014-04-16 | Disposition: A | Discharge status: Home / Self Care | Payer: Medicare Other | Source: Ambulatory Visit | Attending: Radiation Oncology | Admitting: Radiation Oncology

## 2014-04-16 ENCOUNTER — Inpatient Hospital Stay: Admission: RE | Admit: 2014-04-16 | Payer: Medicare Other | Source: Ambulatory Visit

## 2014-04-16 ENCOUNTER — Ambulatory Visit: Payer: Medicare Other

## 2014-04-16 ENCOUNTER — Ambulatory Visit: Payer: Medicare Other | Admitting: Radiation Oncology

## 2014-04-16 DIAGNOSIS — C7949 Secondary malignant neoplasm of other parts of nervous system: Principal | ICD-10-CM

## 2014-04-16 DIAGNOSIS — C7931 Secondary malignant neoplasm of brain: Secondary | ICD-10-CM

## 2014-04-16 MED ORDER — GADOBENATE DIMEGLUMINE 529 MG/ML IV SOLN
18.0000 mL | Freq: Once | INTRAVENOUS | Status: AC | PRN
  Administered 2014-04-16: 18 mL via INTRAVENOUS
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
## 2014-04-17 ENCOUNTER — Encounter (HOSPITAL_COMMUNITY): Payer: Self-pay | Admitting: Hematology & Oncology

## 2014-04-17 ENCOUNTER — Encounter: Payer: Self-pay | Admitting: Radiation Oncology

## 2014-04-17 ENCOUNTER — Encounter (HOSPITAL_BASED_OUTPATIENT_CLINIC_OR_DEPARTMENT_OTHER): Payer: Medicare Other | Admitting: Hematology & Oncology

## 2014-04-17 ENCOUNTER — Ambulatory Visit: Payer: Medicare Other

## 2014-04-17 ENCOUNTER — Ambulatory Visit
Admission: RE | Admit: 2014-04-17 | Discharge: 2014-04-17 | Disposition: A | Discharge status: Home / Self Care | Payer: Medicare Other | Source: Ambulatory Visit | Attending: Radiation Oncology | Admitting: Radiation Oncology

## 2014-04-17 ENCOUNTER — Other Ambulatory Visit: Payer: Medicare Other

## 2014-04-17 VITALS — BP 127/75 | HR 72 | Temp 98.1°F | Resp 14

## 2014-04-17 DIAGNOSIS — C787 Secondary malignant neoplasm of liver and intrahepatic bile duct: Secondary | ICD-10-CM

## 2014-04-17 DIAGNOSIS — C7931 Secondary malignant neoplasm of brain: Secondary | ICD-10-CM

## 2014-04-17 DIAGNOSIS — C4330 Malignant melanoma of unspecified part of face: Secondary | ICD-10-CM

## 2014-04-17 DIAGNOSIS — C7800 Secondary malignant neoplasm of unspecified lung: Secondary | ICD-10-CM

## 2014-04-17 DIAGNOSIS — C7951 Secondary malignant neoplasm of bone: Secondary | ICD-10-CM

## 2014-04-17 MED ORDER — FLUCONAZOLE 200 MG PO TABS: 200.0000 mg | ORAL_TABLET | Freq: Every day | ORAL | Status: AC

## 2014-04-17 MED ORDER — FENTANYL 25 MCG/HR TD PT72: 25.0000 ug | MEDICATED_PATCH | TRANSDERMAL | Status: DC

## 2014-04-17 MED ORDER — DEXAMETHASONE 4 MG PO TABS: 4.0000 mg | ORAL_TABLET | Freq: Four times a day (QID) | ORAL | Status: DC

## 2014-04-17 MED ORDER — GADOBENATE DIMEGLUMINE 529 MG/ML IV SOLN: 18.0000 mL | Freq: Once | INTRAVENOUS | Status: AC | PRN

## 2014-04-17 NOTE — Progress Notes (Signed)
Location/Histology of Brain Tumor: MRI from 04/10/14 showed "12 x 19 x 17 mm enhancing lesion within the medial right cerebellar hemisphere" then, 04/16/2014 3T MRI confirmed the same single metastatic lesion within the right posterior cerebellum but, measured at 12 x 13 x 15 mm.  Patient presented to the Emergency Department on 04/10/14 complaining of 2 episodes of LOC with associated confusion that occurred earlier in the patient's home. Patient's daughter said two nights ago he fell at home.  Past or anticipated interventions, if any, per neurosurgery: none  Past or anticipated interventions, if any, per medical oncology: yes sees Soil scientist at [facility]. He will also be seeing Dr. Imagene Gurney at [facility]. Followed up with [facility] on 12/29.  Dose of Decadron, if applicable: decadron 4 mg qid  Recent neurologic symptoms, if any:   Seizures: no  Headaches: has right earaches  Nausea: no  Dizziness/ataxia: yes patient states he is "crazy dizzy"  Difficulty with hand coordination: he reports he has noticed that his fingers are slow.  Focal numbness/weakness: legs and arms are weak.   Visual deficits/changes: yes states he can't read as well as he used to.  Confusion/Memory deficits: yes - his daughters state that he had extremely confused prior to starting decadron but is better now.  Painful bone metastases at present, if any: yes thoracic spine   04/17/2014 MRI Impression: Metastatic disease throughout T2 vertebral body extending into the left epidural space. The cord is compressed to the right and shows cord edema. The tumor extends into the posterior elements and posterior spinal musculature.  Metastatic disease T7 vertebral body with left-sided epidural tumor displacing the cord but not causing cord compression.  Metastatic disease T11, T12, and L1. Mild ventral epidural tumor at T12 without spinal stenosis  Large lung metastasis noted.   SAFETY ISSUES:  Prior  radiation? 05/09/2013-05/22/2013 30 Gy to right cheek  Pacemaker/ICD? no  Possible current pregnancy? no  Is the patient on methotrexate? no  Additional Complaints / other details: 80 year old male. He reports pain in his left upper abdomen. He is taking oxcodone 5 mg about twice a day.Also, he uses a 25 mcg fentanyl patch but, he changes it every 48 hours instead of every 72.

## 2014-04-17 NOTE — Patient Instructions (Addendum)
[facility] Discharge Instructions  RECOMMENDATIONS MADE BY THE CONSULTANT AND ANY TEST RESULTS WILL BE SENT TO YOUR REFERRING PHYSICIAN.  We will plan on starting your treatment with Nivolumab next week. We will arrange for chemotherapy teaching to discuss the medications, how it is given and side effects. I have refilled your pain patch to be changed every 48 hours since it was not working for the full three days. Please call if you have any needs or concerns prior to f/u with [place]. Bring a urine in for testing when you have completed your antibiotic.  Thank you for choosing [facility] to provide your oncology and hematology care.  To afford each patient quality time with our providers, please arrive at least 15 minutes before your scheduled appointment time.  With your help, our goal is to use those 15 minutes to complete the necessary work-up to ensure our physicians have the information they need to help with your evaluation and healthcare recommendations.    Effective January 1st, 2014, we ask that you re-schedule your appointment with our physicians should you arrive 10 or more minutes late for your appointment.  We strive to give you quality time with our providers, and arriving late affects you and other patients whose appointments are after yours.    Again, thank you for choosing [facility].  Our hope is that these requests will decrease the amount of time that you wait before being seen by our physicians.       _____________________________________________________________  Should you have questions after your visit to [facility], please contact our office at [phone] between the hours of 8:30 a.m. and 5:00 p.m.  Voicemails left after 4:30 p.m. will not be returned until the following business day.  For prescription refill requests, have your pharmacy contact our office with your prescription refill request.     Nivolumab injection What is this medicine? NIVOLUMAB (nye VOL ue mab) is used to treat certain types of melanoma and lung cancer. This medicine may be used for other purposes; ask your health care provider or pharmacist if you have questions. COMMON BRAND NAME(S): Opdivo What should I tell my health care provider before I take this medicine? They need to know if you have any of these conditions: -eye disease, vision problems -history of pancreatitis -immune system problems -inflammatory bowel disease -kidney disease -liver disease -lung disease -lupus -myasthenia gravis -multiple sclerosis -organ transplant -stomach or intestine problems -thyroid disease -tingling of the fingers or toes, or other nerve disorder -an unusual or allergic reaction to nivolumab, other medicines, foods, dyes, or preservatives -pregnant or trying to get pregnant -breast-feeding How should I use this medicine? This medicine is for infusion into a vein. It is given by a health care professional in a hospital or clinic setting. A special MedGuide will be given to you before each treatment. Be sure to read this information carefully each time. Talk to your pediatrician regarding the use of this medicine in children. Special care may be needed. Overdosage: If you think you've taken too much of this medicine contact a poison control center or emergency room at once. Overdosage: If you think you have taken too much of this medicine contact a poison control center or emergency room at once. NOTE: This medicine is only for you. Do not share this medicine with others. What if I miss a dose? It is important not to miss your dose. Call your doctor or  health care professional if you are unable to keep an appointment. What may interact with this medicine? Interactions have not been studied. This list may not describe all possible interactions. Give your health care provider a list of all the medicines, herbs,  non-prescription drugs, or dietary supplements you use. Also tell them if you smoke, drink alcohol, or use illegal drugs. Some items may interact with your medicine. What should I watch for while using this medicine? Tell your doctor or healthcare professional if your symptoms do not start to get better or if they get worse. Your condition will be monitored carefully while you are receiving this medicine. You may need blood work done while you are taking this medicine. What side effects may I notice from receiving this medicine? Side effects that you should report to your doctor or health care professional as soon as possible: -allergic reactions like skin rash, itching or hives, swelling of the face, lips, or tongue -black, tarry stools -bloody or watery diarrhea -changes in vision -chills -cough -depressed mood -eye pain -feeling anxious -fever -general ill feeling or flu-like symptoms -hair loss -loss of appetite -low blood counts - this medicine may decrease the number of white blood cells, red blood cells and platelets. You may be at increased risk for infections and bleeding -pain, tingling, numbness in the hands or feet -redness, blistering, peeling or loosening of the skin, including inside the mouth -red pinpoint spots on skin -signs of decreased platelets or bleeding - bruising, pinpoint red spots on the skin, black, tarry stools, blood in the urine -signs of decreased red blood cells - unusually weak or tired, feeling faint or lightheaded, falls -signs of infection - fever or chills, cough, sore throat, pain or trouble passing urine -signs and symptoms of a dangerous change in heartbeat or heart rhythm like chest pain; dizziness; fast or irregular heartbeat; palpitations; feeling faint or lightheaded, falls; breathing problems -signs and symptoms of high blood sugar such as dizziness; dry mouth; dry skin; fruity breath; nausea; stomach pain; increased hunger or thirst; increased  urination -signs and symptoms of kidney injury like trouble passing urine or change in the amount of urine -signs and symptoms of liver injury like dark yellow or brown urine; general ill feeling or flu-like symptoms; light-colored stools; loss of appetite; nausea; right upper belly pain; unusually weak or tired; yellowing of the eyes or skin -signs and symptoms of increased potassium like muscle weakness; chest pain; or fast, irregular heartbeat -signs and symptoms of low potassium like muscle cramps or muscle pain; chest pain; dizziness; feeling faint or lightheaded, falls; palpitations; breathing problems; or fast, irregular heartbeat -swelling of the ankles, feet, hands -weight gainSide effects that usually do not require medical attention (report to your doctor or health care professional if they continue or are bothersome): -constipation -general ill feeling or flu-like symptoms -hair loss -loss of appetite -nausea, vomiting This list may not describe all possible side effects. Call your doctor for medical advice about side effects. You may report side effects to FDA at 1-800-FDA-1088. Where should I keep my medicine? This drug is given in a hospital or clinic and will not be stored at home. NOTE: This sheet is a summary. It may not cover all possible information. If you have questions about this medicine, talk to your doctor, pharmacist, or health care provider.  2015, [employer]vier/Gold Standard. (2013-06-26 13:18:19)

## 2014-04-17 NOTE — Progress Notes (Signed)
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 Brain metastasis 198.3 C79.31    Narrative:  The patient returns today to coordinate stereotactic radiotherapy.  He has a solitary brain metastasis amenable to [facility].  He had PET positive spinal metastases which were felt to potentially be amenable to [facility] also.  However, spinal MRI imaging shows some degree of radiographic cord compression at two levels (T3, T7).  Clinically, the patient's spinal cord function is intact, and he is ambulatory with a walker.  He has episodes of incontinence, but, has sensation and control to know when they are imminent and when they are occuring.                              ALLERGIES:  is allergic to morphine and morphine and related.  Meds: Current Outpatient Prescriptions  Medication Sig Dispense Refill  . aspirin EC 81 MG tablet Take 81 mg by mouth at bedtime.     . cephALEXin (KEFLEX) 500 MG capsule     . dexamethasone (DECADRON) 4 MG tablet Take 1 tablet (4 mg total) by mouth 4 (four) times daily. To take 8 mg twice daily 120 tablet 0  . Diphenhyd-Hydrocort-Nystatin (FIRST-DUKES MOUTHWASH) SUSP Use as directed 10 mLs in the mouth or throat every 3 (three) hours as needed.    . docusate sodium (STOOL SOFTENER) 100 MG capsule Take 100 mg by mouth daily. Takes 4 tablets every pm    . donepezil (ARICEPT) 10 MG tablet Take 10 mg by mouth every evening.     . fentaNYL (DURAGESIC - DOSED MCG/HR) 25 MCG/HR patch Place 1 patch (25 mcg total) onto the skin every other day. 15 patch 0  . fluconazole (DIFLUCAN) 200 MG tablet Take 1 tablet (200 mg total) by mouth daily. 14 tablet 0  . furosemide (LASIX) 20 MG  tablet Take 1 or 2 tablets each morning to control lower extremity swelling. (Patient taking differently: Take 40 mg by mouth every morning. Take 1 or 2 tablets each morning to control lower extremity swelling.) 60 tablet 3  . LORazepam (ATIVAN) 1 MG tablet Take 1 mg by mouth once. Take 1 tablet (1 mg) 30 minutes prior to procedure.  Disp. 10 talbets. 0 refills.    . metoprolol tartrate (LOPRESSOR) 25 MG tablet Take 25 mg by mouth at bedtime.     . Naproxen Sodium (ALEVE) 220 MG CAPS Take 220 mg by mouth 2 (two) times daily.     Marland Kitchen oxyCODONE (ROXICODONE) 5 MG immediate release tablet Take 1 or 2 tablets every 2-4 hours as needed to control pain. (Patient taking differently: Take 5-10 mg by mouth every 2 (two) hours as needed for moderate pain or severe pain. Take 1 or 2 tablets every 2-4 hours as needed to control pain.) 120 tablet 0  . polyvinyl alcohol (LIQUIFILM TEARS) 1.4 % ophthalmic solution Place 1-2 drops into both eyes daily as needed for dry eyes.    . Saline 0.2 % GEL Apply 1 application to eye 2 (two) times daily. Right Eye    . sennosides-docusate sodium (SENOKOT-S) 8.6-50 MG tablet Take  1 tablet by mouth daily. Takes 4 tablets every am    . sertraline (ZOLOFT) 50 MG tablet Take 100 mg by mouth every evening.     . simvastatin (ZOCOR) 40 MG tablet Take 40 mg by mouth at bedtime.     . Tamsulosin HCl (FLOMAX) 0.4 MG CAPS Take 0.4 mg by mouth at bedtime.      No current facility-administered medications for this encounter.   Facility-Administered Medications Ordered in Other Encounters  Medication Dose Route Frequency Provider Last Rate Last Dose  . topical emolient (BIAFINE) emulsion   Topical BID Blair Promise, MD        Physical Findings: The patient is in no acute distress. Patient is alert and oriented.  vitals were not taken for this visit..  No significant changes.  Lab Findings: Lab Results  Component Value Date   WBC 10.6* 04/10/2014   HGB 13.2 04/10/2014   HCT 40.1  04/10/2014   MCV 85.5 04/10/2014   PLT 191 04/10/2014    '@LASTCHEM'$ @  Radiographic Findings: Mr Jeri Cos FW Contrast  04/16/2014   CLINICAL DATA:  Metastatic melanoma. S RS targeting. Abnormal MRI 04/10/2014  EXAM: MRI HEAD WITHOUT AND WITH CONTRAST  TECHNIQUE: Multiplanar, multiecho pulse sequences of the brain and surrounding structures were obtained without and with intravenous contrast.  CONTRAST:  19mL MULTIHANCE GADOBENATE DIMEGLUMINE 529 MG/ML IV SOLN  COMPARISON:  04/10/2014.  05/04/2013.  Multiple previous.  FINDINGS: The study confirms the presence of a single intracranial mass within the posterior right cerebellar hemisphere measuring 12 x 13 x 15 mm consistent with a metastatic deposit. There is mild adjacent vasogenic edema. No second lesion is identified.  There are old microvascular changes within the pons. There are old lacunar infarctions affecting the thalami, basal ganglia and hemispheric white matter. There is an old right parietal subcortical infarction.  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 No obstructive hydrocephalus. No extra-axial collection. No pituitary mass. No skull or skullbase lesion. Major vessels at the base of the brain show flow.  IMPRESSION: Single metastatic lesion within the right posterior cerebellum measuring 12 x 13 x 15 mm.  Extensive ischemic changes throughout the brain as outlined above.  Chronic right lateral ventricle intraventricular cyst, unchanged.   Electronically Signed   By: Nelson Chimes M.D.   On: 04/16/2014 12:06   Mr Jeri Cos YO Contrast  04/10/2014   CLINICAL DATA:  Initial evaluation for weakness, ataxia.  EXAM: MRI HEAD WITHOUT AND WITH CONTRAST  TECHNIQUE: Multiplanar, multiecho pulse sequences of the brain and surrounding structures were obtained without and with intravenous contrast.  CONTRAST:  90mL MULTIHANCE GADOBENATE DIMEGLUMINE 529 MG/ML IV SOLN  COMPARISON:  Prior CT from 03/04/2014  FINDINGS: Study  is degraded by motion artifact.  Diffuse prominence of the CSF containing spaces is compatible with generalized cerebral atrophy. Patchy and confluent T2/FLAIR hyperintensity within the periventricular and deep white matter both cerebral hemispheres most consistent with chronic small vessel ischemic disease. Remote lacunar infarcts present within the right corona radiata and centrum semi ovale.  Nonspecific intraventricular cyst within the right lateral ventricle measuring 3.8 x 2.7 cm is stable. No internal complexity or septations. Overall ventricular size is stable without hydrocephalus.  There is a in 12 x 19 x 17 mm focus of diffusion abnormality within the medial aspect of the right cerebellar hemisphere (series 100, image 8). No associated hypo intense signal is intensity seen on corresponding ADC map. There is corresponding T2/FLAIR  signal intensity with localized vasogenic edema. There is associated relatively solid enhancement on post-contrast sequences, although poorly evaluated due to extensive motion artifact on post-contrast imaging. Finding is concerning for possible intracranial mass, suspicious for metastasis. Possible subacute infarct could be considered as well, although this is less favored given the relative solid enhancement of this lesion. No other definite lesions are identified, although evaluation is severely limited due to motion artifact.  No mass lesion or midline shift.  No extra-axial fluid collection.  Craniocervical junction within normal limits. Pituitary gland normal.  No acute abnormality seen about the orbits.  Bone marrow signal intensity within normal limits. Scalp soft tissues unremarkable.  Paranasal sinuses clear.  No mastoid effusion.  IMPRESSION: 1. 12 x 19 x 17 mm enhancing lesion within the medial right cerebellar hemisphere. Finding is worrisome for possible intracranial metastasis. While a subacute infarct could conceivably be considered, this is less favored given the  relative solid enhancement and associated vasogenic edema. No other definite lesions identified, however, evaluation is markedly limited due to extensive motion artifact on this examination. 2. Grossly stable atrophy with chronic microvascular ischemic disease and sequela of remote ischemia. Critical Value/emergent results were called by telephone at the time of interpretation on 04/10/2014 at 9:47 pm to Dr. Daleen Bo , who verbally acknowledged these results.   Electronically Signed   By: Jeannine Boga M.D.   On: 04/10/2014 21:50   Mr Thoracic Spine W Wo Contrast  04/17/2014   CLINICAL DATA:  Metastatic melanoma.  EXAM: MRI THORACIC SPINE WITHOUT AND WITH CONTRAST  TECHNIQUE: Multiplanar and multiecho pulse sequences of the thoracic spine were obtained without and with intravenous contrast.  CONTRAST:  18 mL MultiHance IV  COMPARISON:  CT chest 03/04/2014  FINDINGS: Stereotactic radiosurgery protocol was performed with thin sections postcontrast for radiosurgery planning purposes.  The patient was not able to hold still and images are degraded by motion.  Bilateral large lung masses are present consistent with metastatic disease in the lungs.  Extensive metastatic disease throughout the T2 vertebral body. Epidural tumor throughout the left side of the canal from T1 through T3. The cord is displaced to the right and mildly compressed. The cord is mildly edematous at this level. Tumor extends into the posterior spinal muscles and left transverse process and first and second rib.  Asymmetric signal in the right supraspinatus muscle could be tumor or patient positioning.  Metastatic disease T7 vertebral body on the left extending into the posterior elements. There is epidural tumor on the left displacing the cord to the right. No cord compression or cord edema.  Metastatic disease is present at T11, T12, and L1. Mild ventral epidural tumor at T12 without significant spinal stenosis.  Mild degenerative  change in the thoracic spine. No focal disc protrusion.  IMPRESSION: Metastatic disease throughout T2 vertebral body extending into the left epidural space. The cord is compressed to the right and shows cord edema. The tumor extends into the posterior elements and posterior spinal musculature.  Metastatic disease T7 vertebral body with left-sided epidural tumor displacing the cord but not causing cord compression.  Metastatic disease T11, T12, and L1. Mild ventral epidural tumor at T12 without spinal stenosis  Large lung metastasis noted.   Electronically Signed   By: Franchot Gallo M.D.   On: 04/17/2014 13:41   Ct Biopsy  03/23/2014   CLINICAL DATA:  80 year old male with evidence of metastatic disease.  There is concern for either metastatic colon carcinoma or potentially melanoma.  We have been trying to optimize the risk benefit ratio for this patient. The initial biopsy was attempted a left inguinal lymph node which was hypermetabolic on PET-CT. This was nondiagnostic.  The patient is been referred for ultrasound-guided biopsy of a FDG avid liver lesion. After ultrasound survey, this lesion was not visible with ultrasound and accessible percutaneously.  The patient was then transferred to CT for CT-guided biopsy of T7 left transverse process hypermetabolic lesion.  EXAM: CT GUIDED CORE BIOPSY OF T7 LEFT TRANSVERSE PROCESS  ANESTHESIA/SEDATION: 2.0  Mg IV Versed; 100 mcg IV Fentanyl  Total Moderate Sedation Time: 35 minutes.  PROCEDURE: The procedure risks, benefits, and alternatives were explained to the patient. Questions regarding the procedure were encouraged and answered. The patient understands and consents to the procedure.  The patient position in right decubitus and scout CT of the chest was performed. We targeted a left T7 transverse process lesion.  The back was prepped with Betadinein a sterile fashion, and a sterile drape was applied covering the operative field. A sterile gown and sterile gloves  were used for the procedure. Local anesthesia was provided with 1% Lidocaine.  Using CT guidance a trocar needle was advanced into the T7 left transverse process. The stylet was removed and several fine needle aspirates were retrieved. These were sent for cytology and interpretation during the procedure to localize the needle position. Conversation with pathology confirmed abnormal cells though the fine-needle aspirate would not be diagnostic.  We then performed core biopsy with at least 4 separate 18 gauge biopsy. Adequate tissue was retrieved and sent in formalin to pathology.  The patient tolerated procedure well and remained hemodynamically stable throughout.  No complications were encountered and no significant blood loss was encounter.  COMPLICATIONS: None  FINDINGS: Destruction of the T7 left transverse process, compatible with prior PET CT where there is FDG avidity.  The fine-needle aspirate was interpreted by pathology as nondiagnostic, though abnormal cells were present, confirming that the trocar needle was in appropriate position for further core biopsy.  At least 4 separate adequate 18 gauge core biopsy were retrieved.  IMPRESSION: Status post CT-guided core biopsy of left T7 transverse process. Tissue specimen sent to pathology for complete histopathologic analysis.  Signed,  Dulcy Fanny. Earleen Newport, DO  [facility]  [facility]   Electronically Signed   By: Corrie Mckusick D.O.   On: 03/23/2014 16:15    Impression:  The patient has a solitary brain met and also 3 levels of spinal involvement, with radiographic cord compression at T2 and T7, but, no significant clinical signs of cord compression.  Under these circumstances, I would recommend SRS to the brain and the 3 levels of spine involvement.  Plan:  Today, I talked to the patient and family about the findings and work-up thus far.  We discussed the natural history of metastatic melanoma to the brain and  spine and general treatment, highlighting the role of radiotherapy in the management.  We discussed the available radiation techniques, and focused on the details of logistics and delivery.  We reviewed the anticipated acute and late sequelae associated with radiation in this setting.  The patient was encouraged to ask questions that I answered to the best of my ability.  I filled out a patient counseling form during our discussion including treatment diagrams.  We retained a copy for our records.  The patient would like to proceed with radiation and will be scheduled for CT simulation.  I spent  60 minutes minutes face to face with the patient and more than 50% of that time was spent in counseling and/or coordination of care.   _____________________________________  Sheral Apley. Tammi Klippel, M.D.

## 2014-04-18 ENCOUNTER — Ambulatory Visit: Payer: Medicare Other | Admitting: Radiation Oncology

## 2014-04-18 ENCOUNTER — Ambulatory Visit
Admission: RE | Admit: 2014-04-18 | Discharge: 2014-04-18 | Disposition: A | Discharge status: Home / Self Care | Payer: Medicare Other | Source: Ambulatory Visit | Attending: Radiation Oncology | Admitting: Radiation Oncology

## 2014-04-18 ENCOUNTER — Encounter: Payer: Self-pay | Admitting: Radiation Oncology

## 2014-04-18 ENCOUNTER — Other Ambulatory Visit (HOSPITAL_COMMUNITY): Payer: Self-pay | Admitting: Oncology

## 2014-04-18 ENCOUNTER — Ambulatory Visit: Payer: Medicare Other

## 2014-04-18 VITALS — BP 147/64 | HR 75 | Temp 98.1°F | Resp 16 | Ht 69.0 in | Wt 183.0 lb

## 2014-04-18 DIAGNOSIS — C7931 Secondary malignant neoplasm of brain: Secondary | ICD-10-CM

## 2014-04-18 DIAGNOSIS — Z510 Encounter for antineoplastic radiation therapy: Secondary | ICD-10-CM | POA: Diagnosis not present

## 2014-04-18 DIAGNOSIS — C4330 Malignant melanoma of unspecified part of face: Secondary | ICD-10-CM

## 2014-04-18 MED ORDER — DEXAMETHASONE 4 MG PO TABS: 8.0000 mg | ORAL_TABLET | Freq: Two times a day (BID) | ORAL | Status: DC

## 2014-04-18 MED ORDER — SODIUM CHLORIDE 0.9 % IJ SOLN
10.0000 mL | Freq: Once | INTRAMUSCULAR | Status: AC
  Administered 2014-04-18: 10 mL via INTRAVENOUS

## 2014-04-18 NOTE — Progress Notes (Signed)
Removed left AC 22 gauge IV. Catheter intact upon removal. Applied an occlusive dressing to site. Patient tolerated well.

## 2014-04-18 NOTE — Progress Notes (Signed)
Taking decadron 4 mg qid. Reports left upper quadrant abdominal pain but, unable to score or describe pain. Patient confused this morning. Daughter explains the patient has had to take ativan every day this week for scan and that increases confusion. Reports using rolling walker at home to ambulate. Daughter reports having to walk behind the patient because "he wants to fall back." Daughter explains the patient has trouble gripping spoons to eat. Vitals and weight WDL. Reports the he continues to feel dizzy and his right ear continues to ache. Legs and arms remain weak.

## 2014-04-18 NOTE — Progress Notes (Signed)
See progress note under physician encounter. 

## 2014-04-18 NOTE — Progress Notes (Signed)
  Radiation Oncology         [phone] ________________________________  Name: Andre Pollard MRN: [id number]  Date: 04/18/2014  DOB: [birth year]  SIMULATION AND TREATMENT PLANNING NOTE    ICD-9-CM ICD-10-CM   1. 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  Identity was confirmed.  All relevant records and images related to the planned course of therapy were reviewed.  The patient freely provided informed written consent to proceed with treatment after reviewing the details related to the planned course of therapy. The consent form was witnessed and verified by the simulation staff. Intravenous access was established for contrast administration. Then, the patient was set-up in a stable reproducible supine position for radiation therapy.  A relocatable thermoplastic stereotactic head frame was fabricated for precise immobilization.  CT images were obtained.  Surface markings were placed.  The CT images were loaded into the planning software and fused with the patient's targeting MRI scan.  Then the target and avoidance structures were contoured.  Treatment planning then occurred.  The radiation prescription was entered and confirmed.  I have requested 3D planning  I have requested a DVH of the following structures: Brain stem, brain, left eye, right eye, lenses, optic chiasm, target volumes, uninvolved brain, and normal tissue.    Then, attention was turned toward setting up the spinal treatment.  The patient was set-up in a stable reproducible  supine position for radiation therapy.  A BodyFix immobilization pillow was fabricated for reproducible positioning.  Surface markings were placed.  The CT images were loaded into the planning software.  The three spinal gross target volumes (GTV) and planning target volumes (PTV) were  delineated, and avoidance structures were contoured.  Treatment planning then occurred.  The radiation prescription was entered and confirmed.  A total of two complex treatment devices were fabricated in the form of the BodyFix immobilization pillow and a neck accuform cushion.  I have requested : 3D Simulation  I have requested a DVH of the following structures: targets and all normal structures near the target as outlined on the radiation plan to maintain doses in adherence with established limits  PLAN:  The patient will receive 20 Gy in one fraction to the brain metastasis and 18 Gy to each of the spinal metastases at T2, T7 and T12.  ________________________________  Sheral Apley Tammi Klippel, M.D.

## 2014-04-18 NOTE — Progress Notes (Signed)
Daughter reports giving patient one ativan tablet in preparation for simulation. Started left AC 22 gauge IV on the second attempt. Excellent blood return. Secured IV in place. Patient tolerated well. Wheeled patient to CT for simulation.

## 2014-04-18 NOTE — Progress Notes (Signed)
Daughter confirms patient does not have a pacemaker. Daughter confirms patient is allergic to morphine. 04/10/2014 BUN and creatinine WDL.

## 2014-04-19 ENCOUNTER — Other Ambulatory Visit: Payer: Medicare Other

## 2014-04-19 ENCOUNTER — Ambulatory Visit: Payer: Medicare Other

## 2014-04-23 ENCOUNTER — Encounter: Payer: Self-pay | Admitting: Radiation Oncology

## 2014-04-23 ENCOUNTER — Ambulatory Visit: Payer: Medicare Other

## 2014-04-23 ENCOUNTER — Other Ambulatory Visit: Payer: Self-pay | Admitting: Radiation Therapy

## 2014-04-23 ENCOUNTER — Other Ambulatory Visit: Payer: Self-pay | Admitting: Radiation Oncology

## 2014-04-23 ENCOUNTER — Ambulatory Visit
Admission: RE | Admit: 2014-04-23 | Discharge: 2014-04-23 | Disposition: A | Discharge status: Home / Self Care | Payer: Medicare Other | Source: Ambulatory Visit | Attending: Internal Medicine | Admitting: Internal Medicine

## 2014-04-23 ENCOUNTER — Ambulatory Visit
Admission: RE | Admit: 2014-04-23 | Discharge: 2014-04-23 | Disposition: A | Discharge status: Home / Self Care | Payer: Medicare Other | Source: Ambulatory Visit | Attending: Radiation Oncology | Admitting: Radiation Oncology

## 2014-04-23 VITALS — BP 170/91 | HR 82 | Resp 18 | Wt 183.0 lb

## 2014-04-23 DIAGNOSIS — C7931 Secondary malignant neoplasm of brain: Secondary | ICD-10-CM

## 2014-04-23 DIAGNOSIS — C7949 Secondary malignant neoplasm of other parts of nervous system: Secondary | ICD-10-CM

## 2014-04-23 DIAGNOSIS — G9520 Unspecified cord compression: Secondary | ICD-10-CM | POA: Diagnosis not present

## 2014-04-23 DIAGNOSIS — N390 Urinary tract infection, site not specified: Secondary | ICD-10-CM | POA: Insufficient documentation

## 2014-04-23 DIAGNOSIS — Z515 Encounter for palliative care: Secondary | ICD-10-CM

## 2014-04-23 DIAGNOSIS — R030 Elevated blood-pressure reading, without diagnosis of hypertension: Secondary | ICD-10-CM | POA: Insufficient documentation

## 2014-04-23 DIAGNOSIS — R300 Dysuria: Secondary | ICD-10-CM

## 2014-04-23 DIAGNOSIS — R531 Weakness: Secondary | ICD-10-CM

## 2014-04-23 DIAGNOSIS — Z66 Do not resuscitate: Secondary | ICD-10-CM

## 2014-04-23 LAB — URINALYSIS, MICROSCOPIC - CHCC
BILIRUBIN (URINE): NEGATIVE
Blood: NEGATIVE
GLUCOSE UR CHCC: NEGATIVE mg/dL
Ketones: NEGATIVE mg/dL
LEUKOCYTE ESTERASE: NEGATIVE
NITRITE: NEGATIVE
Protein: NEGATIVE mg/dL
RBC / HPF: NEGATIVE (ref 0–2)
Specific Gravity, Urine: 1.005 (ref 1.003–1.035)
UROBILINOGEN UR: 0.2 mg/dL (ref 0.2–1)
WBC UA: NEGATIVE (ref 0–2)
pH: 8 (ref 4.6–8.0)

## 2014-04-23 MED ORDER — LORAZEPAM 1 MG PO TABS: 1.0000 mg | ORAL_TABLET | Freq: Three times a day (TID) | ORAL | Status: AC | PRN
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
## 2014-04-23 NOTE — Progress Notes (Signed)
Patient accompanied by daughter and son. Patient alert and oriented to person only. Daughter reports the patient is very confused at night and up most of the night. She explains he complains of feeling hot during the night and stripping his clothes off. Explains he completed the antibiotic for his UTI over the weekend but, continues to complain of pressure and voids frequently. Reports at night he is often off balance wanting to fall back but, does good ambulating during the day with aid of walker. Taking decadron 4 mg qid. Continues to wear depends. Per daughter the last complaint of pain was low back pain. Denies pain today. Blood pressure elevated. Prior to illness patient to Aleve PM to sleep.

## 2014-04-23 NOTE — Progress Notes (Signed)
Quick Note:  Please call patient with normal result.  Thanks. MM ______ 

## 2014-04-23 NOTE — Progress Notes (Signed)
  Radiation Oncology         [phone] ________________________________  Name: Andre Pollard MRN: [id number]  Date: 04/23/2014  DOB: 1930-02-12  Chart Note:  The planning MRI which was intended for stereotactic radiosurgery of the spine is degraded by significant motion artifact and was not able to be used to properly identify the stereotactic position of the spinal cord or spinal target structures. I discussed this with the patient today and presented a variety of options.  The patient will proceed with CT myelogram for spinal cord and target definition for spinal stereotactic radiosurgery.  We will proceed with planned intracranial stereotactic radiosurgery on January 7 and CT myelogram on January 8 for spinal stereotactic radiosurgery on Friday, January 15. It is worth repeating, the patient does have radiographic evidence of spinal cord compression but remains ambulatory with good bowel and bladder function. If he develops clinical spinal cord compression, he understands to contact our clinic emergently to discuss possible decompression options. He will meet with Dr. Annette Stable from neurosurgery on Wednesday of this week. He is also scheduled to begin immunotherapy on Wednesday of this week. I did not adjust his dexamethasone dosage yet, but hopefully we can rapidly taper his dexamethasone following stereotactic radiosurgery to avoid any adverse effect of glucocorticoids on immunotherapy efficacy.  ________________________________  Sheral Apley Tammi Klippel, M.D.

## 2014-04-24 ENCOUNTER — Telehealth: Payer: Self-pay | Admitting: Radiation Oncology

## 2014-04-24 ENCOUNTER — Other Ambulatory Visit: Payer: Self-pay | Admitting: Radiation Oncology

## 2014-04-24 ENCOUNTER — Ambulatory Visit: Payer: Medicare Other

## 2014-04-24 DIAGNOSIS — C7931 Secondary malignant neoplasm of brain: Secondary | ICD-10-CM

## 2014-04-24 NOTE — Addendum Note (Signed)
Encounter addended by: Heywood Footman, RN on: 04/24/2014  2:17 PM<BR>     Documentation filed: Vitals Section

## 2014-04-24 NOTE — Telephone Encounter (Signed)
-----   Message from Lora Paula, MD sent at 04/23/2014  6:32 PM EST ----- Please call patient with normal result.  Thanks. MM

## 2014-04-24 NOTE — Patient Instructions (Signed)
[facility]   CHEMOTHERAPY INSTRUCTIONS  OPDIVO administration: You will receive this medication once every 2 weeks. It will take 1 hour for this medication to infuse.  You have been given an [facility] wallet card. Please keep this in your wallet. You have also been given an Opdivo Patient Monitoring Checklist. On this checklist, while reviewing, if anything becomes a YES - please call us!   OPDIVO is a prescription medicine used to treat a type of skin cancer called melanoma.   The most common side effects of OPDIVO: feeling tired; pain in muscles, bones, and joints; itching skin & RASH. Let us know if you develop a rash.  Tell us immediately if you get these symptoms during an infusion of OPDIVO: chills or shaking; itching or rash; flushing; difficulty breathing; dizziness; fever; and feeling like passing out.   Serious side effects may include:     Lung problems (pneumonitis). Symptoms of pneumonitis may include: new or worsening cough; chest pain; and shortness of breath.     Intestinal problems (colitis) that can lead to tears or holes in your intestine. Signs and symptoms of colitis may include: diarrhea (loose stools) or more bowel movements than usual; blood in your stools or dark, tarry, sticky stools; and severe stomach area (abdomen) pain or tenderness.     Liver problems (hepatitis). Signs and symptoms of hepatitis may include: yellowing of your skin or the whites of your eyes; severe nausea or vomiting; pain on the right side of your stomach area (abdomen); drowsiness; dark urine (tea colored); bleeding or bruising more easily than normal; and feeling less hungry than usual.     Hormone gland problems (especially the thyroid, pituitary, and glands). Signs and symptoms that your hormone glands are not working properly may include: headaches that will not go away or unusual headaches; extreme tiredness, weight gain or weight loss; dizziness or  fainting; changes in mood or behavior, such as decreased sex drive, irritability, or forgetfulness; hair loss; feeling cold; constipation; and voice gets deeper.     Kidney problems, including nephritis and kidney failure. Signs of kidney problems may include: decrease in the amount of urine; blood in your urine; swelling in your ankles; and loss of appetite.     Inflammation of the brain (encephalitis). Signs and symptoms of encephalitis may include: headache; fever; tiredness or weakness; confusion; memory problems; sleepiness; seeing or hearing things that are not really there (hallucinations); seizures; and stiff neck.     Problems in other organs. Signs of these problems may include: rash; changes in eyesight; severe or persistent muscle or joint pains; and severe muscle weakness.   Do not ignore any symptoms!!!!! We need to know if you are having any side effects or potential side effects of this treatment!    EDUCATIONAL MATERIALS GIVEN AND REVIEWED: Specific Instructions Sheets: Opdivo   SELF CARE ACTIVITIES WHILE ON CHEMOTHERAPY: Increase your fluid intake 48 hours prior to treatment and drink at least 2 quarts per day after treatment., No alcohol intake., No aspirin or other medications unless approved by your oncologist., Eat foods that are light and easy to digest., Eat foods at cold or room temperature., No fried, fatty, or spicy foods immediately before or after treatment., Have teeth cleaned professionally before starting treatment. Keep dentures and partial plates clean., Use soft toothbrush and do not use mouthwashes that contain alcohol. Biotene is a good mouthwash that is available at most pharmacies or may be ordered by calling [phone]., Use warm salt water gargles (1 teaspoon salt per 1 quart warm water) before and after meals and at bedtime. Or you may rinse with 2 tablespoons of three -percent hydrogen peroxide mixed in eight ounces of water., Always use sunscreen  with SPF (Sun Protection Factor) of 30 or higher., Use your nausea medication as directed to prevent nausea., Use your stool softener or laxative as directed to prevent constipation. and Use your anti-diarrheal medication as directed to stop diarrhea.  Please wash your hands for at least 30 seconds using warm soapy water. Handwashing is the #1 way to prevent the spread of germs. Stay away from sick people or people who are getting over a cold. If you develop respiratory systems such as green/yellow mucus production or productive cough or persistent cough let us know and we will see if you need an antibiotic. It is a good idea to keep a pair of gloves on when going into grocery stores/[employer] to decrease your risk of coming into contact with germs on the carts, etc. Carry alcohol hand gel with you at all times and use it frequently if out in public. All foods need to be cooked thoroughly. No raw foods. No medium or undercooked meats, eggs. If your food is cooked medium well, it does not need to be hot pink or saturated with bloody liquid at all. Vegetables and fruits need to be washed/rinsed under the faucet with a dish detergent before being consumed. You can eat raw fruits and vegetables unless we tell you otherwise but it would be best if you cooked them or bought frozen. Do not eat off of salad bars or hot bars unless you really trust the cleanliness of the restaurant. If you need dental work, please let Dr. Whitney Muse know before you go for your appointment so that we can coordinate the best possible time for you in regards to your chemo regimen. You need to also let your dentist know that you are actively taking chemo. We may need to do labs prior to your dental appointment. We also want your bowels moving at least every other day. If this is not happening, we need to know so that we can get you on a bowel regimen to help you go.     SYMPTOMS TO REPORT AS SOON AS POSSIBLE AFTER TREATMENT:  FEVER GREATER  THAN 100.5 F  CHILLS WITH OR WITHOUT FEVER  NAUSEA AND VOMITING THAT IS NOT CONTROLLED WITH YOUR NAUSEA MEDICATION  UNUSUAL SHORTNESS OF BREATH  UNUSUAL BRUISING OR BLEEDING  TENDERNESS IN MOUTH AND THROAT WITH OR WITHOUT PRESENCE OF ULCERS  URINARY PROBLEMS  BOWEL PROBLEMS  UNUSUAL RASH    Wear comfortable clothing and clothing appropriate for easy access to any Portacath or PICC line. Let us know if there is anything that we can do to make your therapy better!      I have been informed and understand all of the instructions given to me and have received a copy. I have been instructed to call the clinic [phone] or my family physician as soon as possible for continued medical care, if indicated. I do not have any more questions at this time but understand that I may call the [facility] or the Patient Navigator at [phone] during office hours should I have questions or need assistance in obtaining follow-up care.            Nivolumab injection What is this medicine? NIVOLUMAB (nye VOL ue mab) is used  to treat certain types of melanoma and lung cancer. This medicine may be used for other purposes; ask your health care provider or pharmacist if you have questions. COMMON BRAND NAME(S): Opdivo What should I tell my health care provider before I take this medicine? They need to know if you have any of these conditions: -eye disease, vision problems -history of pancreatitis -immune system problems -inflammatory bowel disease -kidney disease -liver disease -lung disease -lupus -myasthenia gravis -multiple sclerosis -organ transplant -stomach or intestine problems -thyroid disease -tingling of the fingers or toes, or other nerve disorder -an unusual or allergic reaction to nivolumab, other medicines, foods, dyes, or preservatives -pregnant or trying to get pregnant -breast-feeding How should I use this medicine? This medicine is for infusion into  a vein. It is given by a health care professional in a hospital or clinic setting. A special MedGuide will be given to you before each treatment. Be sure to read this information carefully each time. Talk to your pediatrician regarding the use of this medicine in children. Special care may be needed. Overdosage: If you think you've taken too much of this medicine contact a poison control center or emergency room at once. Overdosage: If you think you have taken too much of this medicine contact a poison control center or emergency room at once. NOTE: This medicine is only for you. Do not share this medicine with others. What if I miss a dose? It is important not to miss your dose. Call your doctor or health care professional if you are unable to keep an appointment. What may interact with this medicine? Interactions have not been studied. This list may not describe all possible interactions. Give your health care provider a list of all the medicines, herbs, non-prescription drugs, or dietary supplements you use. Also tell them if you smoke, drink alcohol, or use illegal drugs. Some items may interact with your medicine. What should I watch for while using this medicine? Tell your doctor or healthcare professional if your symptoms do not start to get better or if they get worse. Your condition will be monitored carefully while you are receiving this medicine. You may need blood work done while you are taking this medicine. What side effects may I notice from receiving this medicine? Side effects that you should report to your doctor or health care professional as soon as possible: -allergic reactions like skin rash, itching or hives, swelling of the face, lips, or tongue -black, tarry stools -bloody or watery diarrhea -changes in vision -chills -cough -depressed mood -eye pain -feeling anxious -fever -general ill feeling or flu-like symptoms -hair loss -loss of appetite -low blood counts -  this medicine may decrease the number of white blood cells, red blood cells and platelets. You may be at increased risk for infections and bleeding -pain, tingling, numbness in the hands or feet -redness, blistering, peeling or loosening of the skin, including inside the mouth -red pinpoint spots on skin -signs of decreased platelets or bleeding - bruising, pinpoint red spots on the skin, black, tarry stools, blood in the urine -signs of decreased red blood cells - unusually weak or tired, feeling faint or lightheaded, falls -signs of infection - fever or chills, cough, sore throat, pain or trouble passing urine -signs and symptoms of a dangerous change in heartbeat or heart rhythm like chest pain; dizziness; fast or irregular heartbeat; palpitations; feeling faint or lightheaded, falls; breathing problems -signs and symptoms of high blood sugar such as dizziness; dry mouth; dry  skin; fruity breath; nausea; stomach pain; increased hunger or thirst; increased urination -signs and symptoms of kidney injury like trouble passing urine or change in the amount of urine -signs and symptoms of liver injury like dark yellow or brown urine; general ill feeling or flu-like symptoms; light-colored stools; loss of appetite; nausea; right upper belly pain; unusually weak or tired; yellowing of the eyes or skin -signs and symptoms of increased potassium like muscle weakness; chest pain; or fast, irregular heartbeat -signs and symptoms of low potassium like muscle cramps or muscle pain; chest pain; dizziness; feeling faint or lightheaded, falls; palpitations; breathing problems; or fast, irregular heartbeat -swelling of the ankles, feet, hands -weight gainSide effects that usually do not require medical attention (report to your doctor or health care professional if they continue or are bothersome): -constipation -general ill feeling or flu-like symptoms -hair loss -loss of appetite -nausea, vomiting This list  may not describe all possible side effects. Call your doctor for medical advice about side effects. You may report side effects to FDA at 1-800-FDA-1088. Where should I keep my medicine? This drug is given in a hospital or clinic and will not be stored at home. NOTE: This sheet is a summary. It may not cover all possible information. If you have questions about this medicine, talk to your doctor, pharmacist, or health care provider.  2015, Elsevier/Gold Standard. (2013-06-26 13:18:19)

## 2014-04-24 NOTE — Telephone Encounter (Signed)
Per Dr. Johny Shears order phoned patient's daughter with normal u/a results. She verbalized understanding. Explains her father has an appointment with Dr. Annette Stable at 1000 tomorrow. Provided her with Dr. Marchelle Folks office number. Encouraged her to call with future needs.

## 2014-04-25 ENCOUNTER — Ambulatory Visit: Payer: Medicare Other

## 2014-04-25 ENCOUNTER — Telehealth: Payer: Self-pay | Admitting: Radiation Oncology

## 2014-04-25 ENCOUNTER — Other Ambulatory Visit: Payer: Self-pay | Admitting: Radiation Oncology

## 2014-04-25 ENCOUNTER — Encounter (HOSPITAL_COMMUNITY): Payer: Self-pay

## 2014-04-25 ENCOUNTER — Encounter (HOSPITAL_COMMUNITY): Payer: Medicare Other | Attending: Hematology and Oncology

## 2014-04-25 ENCOUNTER — Encounter (HOSPITAL_COMMUNITY): Payer: Medicare Other

## 2014-04-25 VITALS — Wt 189.6 lb

## 2014-04-25 DIAGNOSIS — C7931 Secondary malignant neoplasm of brain: Secondary | ICD-10-CM | POA: Diagnosis not present

## 2014-04-25 DIAGNOSIS — C439 Malignant melanoma of skin, unspecified: Secondary | ICD-10-CM

## 2014-04-25 DIAGNOSIS — C4330 Malignant melanoma of unspecified part of face: Secondary | ICD-10-CM

## 2014-04-25 DIAGNOSIS — C189 Malignant neoplasm of colon, unspecified: Secondary | ICD-10-CM | POA: Insufficient documentation

## 2014-04-25 DIAGNOSIS — Z79899 Other long term (current) drug therapy: Secondary | ICD-10-CM | POA: Insufficient documentation

## 2014-04-25 DIAGNOSIS — Z5112 Encounter for antineoplastic immunotherapy: Secondary | ICD-10-CM

## 2014-04-25 DIAGNOSIS — Z510 Encounter for antineoplastic radiation therapy: Secondary | ICD-10-CM | POA: Diagnosis not present

## 2014-04-25 DIAGNOSIS — N390 Urinary tract infection, site not specified: Secondary | ICD-10-CM

## 2014-04-25 LAB — TSH: TSH: 1.061 u[IU]/mL (ref 0.350–4.500)

## 2014-04-25 LAB — URINE CULTURE

## 2014-04-25 LAB — COMPREHENSIVE METABOLIC PANEL
ALBUMIN: 2.8 g/dL — AB (ref 3.5–5.2)
ALT: 32 U/L (ref 0–53)
AST: 22 U/L (ref 0–37)
Alkaline Phosphatase: 113 U/L (ref 39–117)
Anion gap: 8 (ref 5–15)
BILIRUBIN TOTAL: 0.5 mg/dL (ref 0.3–1.2)
BUN: 26 mg/dL — AB (ref 6–23)
CHLORIDE: 99 meq/L (ref 96–112)
CO2: 30 mmol/L (ref 19–32)
Calcium: 8.3 mg/dL — ABNORMAL LOW (ref 8.4–10.5)
Creatinine, Ser: 0.97 mg/dL (ref 0.50–1.35)
GFR calc Af Amer: 87 mL/min — ABNORMAL LOW (ref >90)
GFR calc non Af Amer: 75 mL/min — ABNORMAL LOW (ref >90)
GLUCOSE: 232 mg/dL — AB (ref 70–99)
POTASSIUM: 4.4 mmol/L (ref 3.5–5.1)
Sodium: 137 mmol/L (ref 135–145)
TOTAL PROTEIN: 5.3 g/dL — AB (ref 6.0–8.3)

## 2014-04-25 LAB — CBC WITH DIFFERENTIAL/PLATELET
BASOS PCT: 0 % (ref 0–1)
Basophils Absolute: 0 10*3/uL (ref 0.0–0.1)
Eosinophils Absolute: 0 10*3/uL (ref 0.0–0.7)
Eosinophils Relative: 0 % (ref 0–5)
HCT: 42.3 % (ref 39.0–52.0)
Hemoglobin: 13.9 g/dL (ref 13.0–17.0)
LYMPHS ABS: 1 10*3/uL (ref 0.7–4.0)
LYMPHS PCT: 5 % — AB (ref 12–46)
MCH: 28.4 pg (ref 26.0–34.0)
MCHC: 32.9 g/dL (ref 30.0–36.0)
MCV: 86.3 fL (ref 78.0–100.0)
Monocytes Absolute: 0.6 10*3/uL (ref 0.1–1.0)
Monocytes Relative: 3 % (ref 3–12)
NEUTROS PCT: 92 % — AB (ref 43–77)
Neutro Abs: 19.3 10*3/uL — ABNORMAL HIGH (ref 1.7–7.7)
PLATELETS: 172 10*3/uL (ref 150–400)
RBC: 4.9 MIL/uL (ref 4.22–5.81)
RDW: 13.5 % (ref 11.5–15.5)
WBC: 20.9 10*3/uL — AB (ref 4.0–10.5)

## 2014-04-25 MED ORDER — CIPROFLOXACIN HCL 500 MG PO TABS: 500.0000 mg | ORAL_TABLET | Freq: Two times a day (BID) | ORAL | Status: AC

## 2014-04-25 MED ORDER — ONDANSETRON HCL 8 MG PO TABS: 8.0000 mg | ORAL_TABLET | Freq: Three times a day (TID) | ORAL | Status: AC | PRN

## 2014-04-25 MED ORDER — NIVOLUMAB CHEMO INJECTION 100 MG/10ML
3.0000 mg/kg | Freq: Once | INTRAVENOUS | Status: AC
  Administered 2014-04-25: 250 mg via INTRAVENOUS
  Filled 2014-04-25: qty 25

## 2014-04-25 MED ORDER — SODIUM CHLORIDE 0.9 % IJ SOLN: 10.0000 mL | INTRAMUSCULAR | Status: DC | PRN

## 2014-04-25 MED ORDER — HEPARIN SOD (PORK) LOCK FLUSH 100 UNIT/ML IV SOLN
500.0000 [IU] | Freq: Once | INTRAVENOUS | Status: DC | PRN
Start: 2014-04-25 — End: 2014-04-25

## 2014-04-25 MED ORDER — SODIUM CHLORIDE 0.9 % IV SOLN
Freq: Once | INTRAVENOUS | Status: AC
  Administered 2014-04-25: 14:00:00 via INTRAVENOUS

## 2014-04-25 NOTE — Progress Notes (Signed)
Andre Pollard Tolerated chemotherapy well today

## 2014-04-25 NOTE — Telephone Encounter (Signed)
-----   Message from Lora Paula, MD sent at 04/25/2014  1:32 PM EST ----- Sam, Please call patient that UA was normal, but culture is positive.  I will e-prescribe antibiotic. MM

## 2014-04-25 NOTE — Telephone Encounter (Signed)
Per Dr. Johny Shears order phone patient's daughter, [name], explaining urine culture was positive for infection. Informed her cipro 500 mg bid had been escribed to [facility]. She verbalized understanding and expressed appreciation for the call.

## 2014-04-25 NOTE — Patient Instructions (Signed)
[facility] Discharge Instructions for Patients Receiving Chemotherapy  Today you received the following chemotherapy agents opdivo  To help prevent nausea and vomiting after your treatment, we encourage you to take your nausea medication   If you develop nausea and vomiting that is not controlled by your nausea medication, call the clinic. If it is after clinic hours your family physician or the after hours number for the clinic or go to the Emergency Department.   BELOW ARE SYMPTOMS THAT SHOULD BE REPORTED IMMEDIATELY:  *FEVER GREATER THAN 101.0 F  *CHILLS WITH OR WITHOUT FEVER  NAUSEA AND VOMITING THAT IS NOT CONTROLLED WITH YOUR NAUSEA MEDICATION  *UNUSUAL SHORTNESS OF BREATH  *UNUSUAL BRUISING OR BLEEDING  TENDERNESS IN MOUTH AND THROAT WITH OR WITHOUT PRESENCE OF ULCERS  *URINARY PROBLEMS  *BOWEL PROBLEMS  UNUSUAL RASH Items with * indicate a potential emergency and should be followed up as soon as possible.  One of the nurses will contact you 24 hours after your treatment. Please let the nurse know about any problems that you may have experienced. Feel free to call the clinic you have any questions or concerns. The clinic phone number is [phone].   I have been informed and understand all the instructions given to me. I know to contact the clinic, my physician, or go to the Emergency Department if any problems should occur. I do not have any questions at this time, but understand that I may call the clinic during office hours or the Patient Navigator at [phone] should I have any questions or need assistance in obtaining follow up care.   Nivolumab injection What is this medicine? NIVOLUMAB (nye VOL ue mab) is used to treat certain types of melanoma and lung cancer. This medicine may be used for other purposes; ask your health care provider or pharmacist if you have questions. COMMON BRAND NAME(S): Opdivo What should I tell my health care  provider before I take this medicine? They need to know if you have any of these conditions: -eye disease, vision problems -history of pancreatitis -immune system problems -inflammatory bowel disease -kidney disease -liver disease -lung disease -lupus -myasthenia gravis -multiple sclerosis -organ transplant -stomach or intestine problems -thyroid disease -tingling of the fingers or toes, or other nerve disorder -an unusual or allergic reaction to nivolumab, other medicines, foods, dyes, or preservatives -pregnant or trying to get pregnant -breast-feeding How should I use this medicine? This medicine is for infusion into a vein. It is given by a health care professional in a hospital or clinic setting. A special MedGuide will be given to you before each treatment. Be sure to read this information carefully each time. Talk to your pediatrician regarding the use of this medicine in children. Special care may be needed. Overdosage: If you think you've taken too much of this medicine contact a poison control center or emergency room at once. Overdosage: If you think you have taken too much of this medicine contact a poison control center or emergency room at once. NOTE: This medicine is only for you. Do not share this medicine with others. What if I miss a dose? It is important not to miss your dose. Call your doctor or health care professional if you are unable to keep an appointment. What may interact with this medicine? Interactions have not been studied. This list may not describe all possible interactions. Give your health care provider a list of all the medicines, herbs, non-prescription drugs, or dietary supplements you  use. Also tell them if you smoke, drink alcohol, or use illegal drugs. Some items may interact with your medicine. What should I watch for while using this medicine? Tell your doctor or healthcare professional if your symptoms do not start to get better or if they  get worse. Your condition will be monitored carefully while you are receiving this medicine. You may need blood work done while you are taking this medicine. What side effects may I notice from receiving this medicine? Side effects that you should report to your doctor or health care professional as soon as possible: -allergic reactions like skin rash, itching or hives, swelling of the face, lips, or tongue -black, tarry stools -bloody or watery diarrhea -changes in vision -chills -cough -depressed mood -eye pain -feeling anxious -fever -general ill feeling or flu-like symptoms -hair loss -loss of appetite -low blood counts - this medicine may decrease the number of white blood cells, red blood cells and platelets. You may be at increased risk for infections and bleeding -pain, tingling, numbness in the hands or feet -redness, blistering, peeling or loosening of the skin, including inside the mouth -red pinpoint spots on skin -signs of decreased platelets or bleeding - bruising, pinpoint red spots on the skin, black, tarry stools, blood in the urine -signs of decreased red blood cells - unusually weak or tired, feeling faint or lightheaded, falls -signs of infection - fever or chills, cough, sore throat, pain or trouble passing urine -signs and symptoms of a dangerous change in heartbeat or heart rhythm like chest pain; dizziness; fast or irregular heartbeat; palpitations; feeling faint or lightheaded, falls; breathing problems -signs and symptoms of high blood sugar such as dizziness; dry mouth; dry skin; fruity breath; nausea; stomach pain; increased hunger or thirst; increased urination -signs and symptoms of kidney injury like trouble passing urine or change in the amount of urine -signs and symptoms of liver injury like dark yellow or brown urine; general ill feeling or flu-like symptoms; light-colored stools; loss of appetite; nausea; right upper belly pain; unusually weak or tired;  yellowing of the eyes or skin -signs and symptoms of increased potassium like muscle weakness; chest pain; or fast, irregular heartbeat -signs and symptoms of low potassium like muscle cramps or muscle pain; chest pain; dizziness; feeling faint or lightheaded, falls; palpitations; breathing problems; or fast, irregular heartbeat -swelling of the ankles, feet, hands -weight gainSide effects that usually do not require medical attention (report to your doctor or health care professional if they continue or are bothersome): -constipation -general ill feeling or flu-like symptoms -hair loss -loss of appetite -nausea, vomiting This list may not describe all possible side effects. Call your doctor for medical advice about side effects. You may report side effects to FDA at 1-800-FDA-1088. Where should I keep my medicine? This drug is given in a hospital or clinic and will not be stored at home. NOTE: This sheet is a summary. It may not cover all possible information. If you have questions about this medicine, talk to your doctor, pharmacist, or health care provider.  2015, [employer]vier/Gold Standard. (2013-06-26 13:18:19)

## 2014-04-25 NOTE — Progress Notes (Signed)
Opdivo teaching done and consent signed. Distress screening done. Zofran called into [facility].

## 2014-04-26 ENCOUNTER — Inpatient Hospital Stay: Admission: RE | Admit: 2014-04-26 | Payer: Medicare Other | Source: Ambulatory Visit

## 2014-04-26 ENCOUNTER — Encounter (HOSPITAL_COMMUNITY): Payer: Self-pay | Admitting: Emergency Medicine

## 2014-04-26 ENCOUNTER — Telehealth (HOSPITAL_COMMUNITY): Payer: Self-pay

## 2014-04-26 ENCOUNTER — Ambulatory Visit: Payer: Medicare Other

## 2014-04-26 ENCOUNTER — Telehealth (HOSPITAL_COMMUNITY): Payer: Self-pay | Admitting: *Deleted

## 2014-04-26 ENCOUNTER — Ambulatory Visit
Admission: RE | Admit: 2014-04-26 | Discharge: 2014-04-26 | Disposition: A | Discharge status: Home / Self Care | Payer: Medicare Other | Source: Ambulatory Visit | Attending: Radiation Oncology | Admitting: Radiation Oncology

## 2014-04-26 VITALS — BP 136/78 | HR 71 | Temp 98.1°F | Resp 16

## 2014-04-26 DIAGNOSIS — C7931 Secondary malignant neoplasm of brain: Secondary | ICD-10-CM

## 2014-04-26 DIAGNOSIS — Z510 Encounter for antineoplastic radiation therapy: Secondary | ICD-10-CM | POA: Diagnosis not present

## 2014-04-26 NOTE — Progress Notes (Signed)
Attempted to call for 24 hr follow up no answer

## 2014-04-26 NOTE — Telephone Encounter (Signed)
24h follow up: Pt doing good today other than the grogginess he is experiencing from the Ativan per his daughter. Patient to go for [facility] today.   Patient's daughter Romie Levee called and given instructions regarding cutting the Dexamethasone back to 1 4mg  tablet TID instead of QID per Dr. Whitney Muse. She said ok. I also told her that she could give patient a Benadryl tablet (25mg ) if he got to itching from the [place] but to also call us and let us know that he is having the itching. Patient's daughter also instructed to cut the Ativan in half at bedtime to see if he has less problems with feeling very groggy and being off balance in the am. She said that Ativan is helping him sleep through the night but he is being left with the previously mentioned symptoms. She said she would try this.

## 2014-04-26 NOTE — Progress Notes (Signed)
glynns daughter called and stated that he has been tired but has been doing well after chemotherapy

## 2014-04-26 NOTE — Progress Notes (Signed)
Patient transferred to rm 1 per wheelchair following SRS to brain metastasis. Patient resting quietly in recliner, rm 1; daughter by his side. VS WNL. Pt reports pain in his lower back which is not a new pain for him. He is lethargic, took Ativan 30 min prior to [facility] treatment. Pt oriented x 3, denies nausea, other issues. He is drinking soda.

## 2014-04-26 NOTE — Op Note (Signed)
  Name: Andre Pollard  MRN: [id number]  Date: 04/26/2014   DOB: 27-Jun-1929  Stereotactic Radiosurgery Operative Note  PRE-OPERATIVE DIAGNOSIS:  Solitary Brain Metastasis  POST-OPERATIVE DIAGNOSIS:  Solitary Brain Metastasis  PROCEDURE:  Stereotactic Radiosurgery  SURGEON:  Charlie Pitter, MD  NARRATIVE: The patient underwent a radiation treatment planning session in the radiation oncology simulation suite under the care of the radiation oncology physician and physicist.  I participated closely in the radiation treatment planning afterwards. The patient underwent planning CT which was fused to 3T high resolution MRI with 1 mm axial slices.  These images were fused on the planning system.  We contoured the gross target volumes and subsequently expanded this to yield the Planning Target Volume. I actively participated in the planning process.  I helped to define and review the target contours and also the contours of the optic pathway, eyes, brainstem and selected nearby organs at risk.  All the dose constraints for critical structures were reviewed and compared to AAPM Task Group 101.  The prescription dose conformity was reviewed.  I approved the plan electronically.    Accordingly, [name] was brought to the TrueBeam stereotactic radiation treatment linac and placed in the custom immobilization mask.  The patient was aligned according to the IR fiducial markers with BrainLab Exactrac, then orthogonal x-rays were used in ExacTrac with the 6DOF robotic table and the shifts were made to align the patient  [name] received stereotactic radiosurgery uneventfully.    The detailed description of the procedure is recorded in the radiation oncology procedure note.  I was present for the duration of the procedure.  DISPOSITION:  Following delivery, the patient was transported to nursing in stable condition and monitored for possible acute effects to be discharged to home in stable condition  with follow-up in one month.  Charlie Pitter, MD 04/26/2014 12:34 PM

## 2014-04-26 NOTE — Telephone Encounter (Signed)
Per Dr. Whitney Muse, daughter instructed to decrease Andre Pollard's decadron to 1 pill 3 times daily and beginning Tuesday or Wednesday to decrease it to 1 pill twice daily.  Verbalized understanding of instructions. Daughter states that Sertraline was restarted on Saturday and instead of giving 100 mg at HS they are giving him 50 mg, also, has decreased Ativan dosage by half to combat the grogginess that he is experiencing each morning.  Will keep [place] updated on any other changes.

## 2014-04-26 NOTE — Telephone Encounter (Signed)
Per Dr. Whitney Muse patient is to take diflucan daily.  Daughter Romie Levee)  notified and verbalized understanding of instructions.

## 2014-04-26 NOTE — Progress Notes (Addendum)
Patient less lethargic. Vitals stable. Denies pain. Assisted daughter in changing soiled clothes and depends. Understands to contact staff with needs. Taking decadron 4 mg tid. Daughter reports giving patient 1/2 an Ativan tablet before helps "[place] all get a good night rest." Scheduled for myelogram tomorrow. Denies headache, dizziness, nausea, diplopia or ringing in the ears. Discharged patient home with daughter. Patient exited in wheelchair being pushed by daughter.

## 2014-04-26 NOTE — Telephone Encounter (Signed)
Call from daughter.  Wants to know if diflucan is 1 tablet daily or if he was to take 1 tablet only, then save the rest if symptoms recurred?

## 2014-04-26 NOTE — Progress Notes (Signed)
  Radiation Oncology         [phone] ________________________________  Stereotactic Treatment Procedure Note  Name: Andre Pollard MRN: [id number]  Date: 04/26/2014  DOB: [birth year]  SPECIAL TREATMENT PROCEDURE    ICD-9-CM ICD-10-CM   1. Brain metastasis 198.3 C79.31     3D TREATMENT PLANNING AND DOSIMETRY:  The patient's radiation plan was reviewed and approved by neurosurgery and radiation oncology prior to treatment.  It showed 3-dimensional radiation distributions overlaid onto the planning CT/MRI image set.  The [facility] for the target structures as well as the organs at risk were reviewed. The documentation of the 3D plan and dosimetry are filed in the radiation oncology EMR.  NARRATIVE:  Andre Pollard was brought to the TrueBeam stereotactic radiation treatment machine and placed supine on the CT couch. The head frame was applied, and the patient was set up for stereotactic radiosurgery.  Neurosurgery was present for the set-up and delivery  SIMULATION VERIFICATION:  In the couch zero-angle position, the patient underwent Exactrac imaging using the Brainlab system with orthogonal KV images.  These were carefully aligned and repeated to confirm treatment position for each of the isocenters.  The Exactrac snap film verification was repeated at each couch angle.  SPECIAL TREATMENT PROCEDURE: Andre Pollard received stereotactic radiosurgery to the following targets: Right cerebellar 15 mm target was treated using 2 Dynamic Conformal Arcs to a prescription dose of 20 Gy.  ExacTrac registration was performed for each couch angle.  The 82.6% isodose line was prescribed.  6 MV X-rays were delivered in the flattening filter free beam mode.  STEREOTACTIC TREATMENT MANAGEMENT:  Following delivery, the patient was transported to nursing in stable condition and monitored for possible acute effects.  Vital signs were recorded BP 136/78 mmHg  Pulse 71  Temp(Src) 98.1 F (36.7 C) (Oral)   Resp 16  SpO2 100%. The patient tolerated treatment without significant acute effects, and was discharged to home in stable condition.    PLAN: Follow-up in one month.  ________________________________  Sheral Apley. Tammi Klippel, M.D.

## 2014-04-27 ENCOUNTER — Ambulatory Visit
Admission: RE | Admit: 2014-04-27 | Discharge: 2014-04-27 | Disposition: A | Discharge status: Home / Self Care | Payer: Medicare Other | Source: Ambulatory Visit | Attending: Radiation Oncology | Admitting: Radiation Oncology

## 2014-04-27 ENCOUNTER — Ambulatory Visit: Payer: Medicare Other

## 2014-04-27 DIAGNOSIS — C7949 Secondary malignant neoplasm of other parts of nervous system: Secondary | ICD-10-CM

## 2014-04-27 MED ORDER — IOHEXOL 300 MG/ML  SOLN
10.0000 mL | Freq: Once | INTRAMUSCULAR | Status: AC | PRN
  Administered 2014-04-27: 10 mL via INTRATHECAL

## 2014-04-27 NOTE — Discharge Instructions (Signed)
Myelogram Discharge Instructions  1. Go home and rest quietly for the next 24 hours.  It is important to lie flat for the next 24 hours.  Get up only to go to the restroom.  You may lie in the bed or on a couch on your back, your stomach, your left side or your right side.  You may have one pillow under your head.  You may have pillows between your knees while you are on your side or under your knees while you are on your back.  2. DO NOT drive today.  Recline the seat as far back as it will go, while still wearing your seat belt, on the way home.  3. You may get up to go to the bathroom as needed.  You may sit up for 10 minutes to eat.  You may resume your normal diet and medications unless otherwise indicated.  Drink lots of extra fluids today and tomorrow.  4. The incidence of headache, nausea, or vomiting is about 5% (one in 20 patients).  If you develop a headache, lie flat and drink plenty of fluids until the headache goes away.  Caffeinated beverages may be helpful.  If you develop severe nausea and vomiting or a headache that does not go away with flat bed rest, call [phone].  5. You may resume normal activities after your 24 hours of bed rest is over; however, do not exert yourself strongly or do any heavy lifting tomorrow. If when you get up you have a headache when standing, go back to bed and force fluids for another 24 hours.  6. Call your physician for a follow-up appointment.  The results of your myelogram will be sent directly to your physician by the following day.  7. If you have any questions or if complications develop after you arrive home, please call [phone].  Discharge instructions have been explained to the patient.  The patient, or the person responsible for the patient, fully understands these instructions.      May resume Zoloft on Jan. 9, 2016, after 9:30 am.

## 2014-04-27 NOTE — Progress Notes (Signed)
Pt has been off Zoloft for the past 2 days. Pt has taken his own Ativan this am.

## 2014-04-30 ENCOUNTER — Telehealth: Payer: Self-pay | Admitting: Radiation Oncology

## 2014-04-30 ENCOUNTER — Encounter (HOSPITAL_COMMUNITY): Payer: Self-pay | Admitting: Oncology

## 2014-04-30 NOTE — Assessment & Plan Note (Addendum)
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  Oncology history populated.  S/P SRS to right cerebellar lesion to 20 Gy by Dr. Tammi Klippel.  Started Nivolumab on 04/25/2014.  Will maintain current steroid dose of 4 mg BID for now.  We will taper in th future. Return next week for cycle 2 of Nivolumab.  Will restage on week 9 of treatment.  Return in 1 week for follow-up.  See Interval history for more details.

## 2014-04-30 NOTE — Telephone Encounter (Signed)
Lillia Pauls, RN phoned this RN and left a message requesting return call. Louretta Shorten, RN requesting verbal order for home health aid three times per week and occupational therapy. Verbal order given.

## 2014-04-30 NOTE — Progress Notes (Signed)
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 certainly see the patient much sooner if necessary.  Patient and plan discussed with Dr. Ancil Linsey and she is in agreement with the aforementioned.   KEFALAS,THOMAS 05/02/2014

## 2014-05-02 ENCOUNTER — Encounter (HOSPITAL_BASED_OUTPATIENT_CLINIC_OR_DEPARTMENT_OTHER): Payer: Medicare Other | Admitting: Oncology

## 2014-05-02 ENCOUNTER — Ambulatory Visit (HOSPITAL_COMMUNITY): Payer: Medicare Other | Admitting: Oncology

## 2014-05-02 ENCOUNTER — Encounter (HOSPITAL_COMMUNITY): Payer: Self-pay | Admitting: Oncology

## 2014-05-02 VITALS — BP 155/78 | HR 108 | Temp 97.8°F | Resp 20

## 2014-05-02 DIAGNOSIS — C439 Malignant melanoma of skin, unspecified: Secondary | ICD-10-CM

## 2014-05-02 DIAGNOSIS — F32A Depression, unspecified: Secondary | ICD-10-CM

## 2014-05-02 DIAGNOSIS — C4330 Malignant melanoma of unspecified part of face: Secondary | ICD-10-CM

## 2014-05-02 DIAGNOSIS — F329 Major depressive disorder, single episode, unspecified: Secondary | ICD-10-CM

## 2014-05-02 DIAGNOSIS — K219 Gastro-esophageal reflux disease without esophagitis: Secondary | ICD-10-CM

## 2014-05-02 DIAGNOSIS — N390 Urinary tract infection, site not specified: Secondary | ICD-10-CM

## 2014-05-02 DIAGNOSIS — C7931 Secondary malignant neoplasm of brain: Secondary | ICD-10-CM

## 2014-05-02 DIAGNOSIS — R142 Eructation: Secondary | ICD-10-CM

## 2014-05-02 MED ORDER — OMEPRAZOLE 20 MG PO CPDR: 20.0000 mg | DELAYED_RELEASE_CAPSULE | Freq: Every day | ORAL | Status: AC

## 2014-05-02 MED ORDER — DEXAMETHASONE 4 MG PO TABS: 4.0000 mg | ORAL_TABLET | Freq: Two times a day (BID) | ORAL | Status: AC

## 2014-05-02 MED ORDER — SERTRALINE HCL 50 MG PO TABS: 50.0000 mg | ORAL_TABLET | Freq: Every evening | ORAL | Status: AC

## 2014-05-02 NOTE — Patient Instructions (Signed)
[facility]  Discharge Instructions:  Tolerating therapy well thus far. Next treatment is next week.   Continue Dexamethasone 4 mg twice daily.  Rx for Prilosec 20 mg daily.  This is sent to your [facility]. Discontinue Simvastatin at this time. Rx for Alarm mat for reclining chair. Return next week for your next treatment.  _______________________________________________________________  Thank you for choosing [facility] to provide your oncology and hematology care.  To afford each patient quality time with our providers, please arrive at least 15 minutes before your scheduled appointment.  You need to re-schedule your appointment if you arrive 10 or more minutes late.  We strive to give you quality time with our providers, and arriving late affects you and other patients whose appointments are after yours.  Also, if you no show three or more times for appointments you may be dismissed from the clinic.  Again, thank you for choosing [facility] hope is that these requests will allow you access to exceptional care and in a timely manner. _______________________________________________________________  If you have questions after your visit, please contact our office at [phone] between the hours of 8:30 a.m. and 5:00 p.m. Voicemails left after 4:30 p.m. will not be returned until the following business day. _______________________________________________________________  For prescription refill requests, have your pharmacy contact our office. _______________________________________________________________  Recommendations made by the consultant and any test results will be sent to your referring physician. _______________________________________________________________

## 2014-05-02 NOTE — Assessment & Plan Note (Signed)
?  GERD?  Rx for Omeprazole electronically prescribed

## 2014-05-03 ENCOUNTER — Encounter (HOSPITAL_COMMUNITY): Payer: Self-pay | Admitting: Oncology

## 2014-05-04 ENCOUNTER — Encounter: Payer: Self-pay | Admitting: Radiation Oncology

## 2014-05-04 ENCOUNTER — Ambulatory Visit
Admission: RE | Admit: 2014-05-04 | Discharge: 2014-05-04 | Disposition: A | Discharge status: Home / Self Care | Payer: Medicare Other | Source: Ambulatory Visit | Attending: Radiation Oncology | Admitting: Radiation Oncology

## 2014-05-04 VITALS — BP 160/80 | HR 110 | Temp 98.2°F

## 2014-05-04 DIAGNOSIS — Z510 Encounter for antineoplastic radiation therapy: Secondary | ICD-10-CM

## 2014-05-04 DIAGNOSIS — C7931 Secondary malignant neoplasm of brain: Secondary | ICD-10-CM

## 2014-05-04 NOTE — Progress Notes (Signed)
  Radiation Oncology         [phone] ________________________________  Spinal Stereotactic Radiosurgery Procedure Note  Name: KIVEN VANGILDER MRN: [id number]  Date: 05/04/2014  DOB: 04/04/1930  SPECIAL TREATMENT PROCEDURE    ICD-9-CM ICD-10-CM   1. Brain metastasis 198.3 C79.31     3D TREATMENT PLANNING AND DOSIMETRY:  The patient's radiation plan was reviewed and approved by neurosurgery and radiation oncology prior to treatment.  It showed 3-dimensional radiation distributions overlaid onto the planning CT/MRI image set.  The [facility] for the target structures as well as the organs at risk were reviewed. The documentation of the 3D plan and dosimetry are filed in the radiation oncology EMR.  NARRATIVE:  RANON COVEN was brought to the TrueBeam stereotactic radiation treatment machine and placed supine on the CT couch. The patient was precisely re-positioned in their BodyFix immobilization device, and the patient was set up for stereotactic radiosurgery.  Dr. Annette Stable from neurosurgery was present for the set-up and delivery  SIMULATION VERIFICATION:  In the couch zero-angle position, the patient underwent Exactrac imaging using the Brainlab system with orthogonal KV images to position the target accounting for translation and rotational factors.  These were carefully aligned and repeated to confirm treatment position.  Then, cone beam CT was performed to help further verify placement and make any final translational shifts.  SPECIAL TREATMENT PROCEDURE: [name] received stereotactic radiosurgery to the following targets:  The targeted metastasis in the T2 vertebral body and left pedicle was treated using 2 Rapid Arc VMAT Beams to a prescription dose of 18 Gy to the gross disease (GTV) seen on imaging, while a secondary lower prescription dose of 14 Gy was delivered to the entirety of each directly involved marrow compartment of the gross disease (CTV).  The beams were delivered with  6 MV X-rays in the flattening filter free mode.  The targeted metastasis in the T7 vertebral body and left pedicle was treated using 2 Rapid Arc VMAT Beams to a prescription dose of 18 Gy to the gross disease (GTV) seen on imaging, while a secondary lower prescription dose of 14 Gy was delivered to the entirety of each directly involved marrow compartment of the gross disease (CTV).    The beams were delivered with 6 MV X-rays in the flattening filter free mode.  The targeted metastasis in the T12 vertebral body was treated using 3 Rapid Arc VMAT Beams to a prescription dose of 18 Gy to the gross disease (GTV) seen on imaging, while a secondary lower prescription dose of 14 Gy was delivered to the entirety of each directly involved marrow compartment of the gross disease (CTV).    The beams were delivered with 6 MV X-rays in the flattening filter free mode.  STEREOTACTIC TREATMENT MANAGEMENT:  Following delivery, the patient was transported to nursing in stable condition and monitored for possible acute effects.  Vital signs were recorded BP 160/80 mmHg  Pulse 110  Temp(Src) 98.2 F (36.8 C). The patient tolerated treatment without significant acute effects, and was discharged to home in stable condition.    PLAN: Follow-up in one month.  ________________________________  Sheral Apley. Tammi Klippel, M.D.

## 2014-05-04 NOTE — Op Note (Signed)
   Name: Andre Pollard  MRN: [id number]  Date: 05/04/2014   DOB: 01/31/1930  Stereotactic Radiosurgery Operative Note  PRE-OPERATIVE DIAGNOSIS:  Spinal Metastasis x 3   T 2,  T7,  T12  POST-OPERATIVE DIAGNOSIS:  Spinal Metastasis x3  PROCEDURE:  Stereotactic Radiosurgery  SURGEON:  Charlie Pitter, MD  NARRATIVE: The patient underwent a radiation treatment planning session in the radiation oncology simulation suite under the care of the radiation oncology physician and physicist.  I participated closely in the radiation treatment planning afterwards. The patient underwent planning CT myelogram which was fused to the MRI.  These images were fused on the planning system.  Radiation oncology contoured the gross target volume and subsequently expanded this to yield the Planning Target Volume. I actively participated in the planning process.  I helped to define and review the target contours and also the contours of the spinal cord, and selected nearby organs at risk.  All the dose constraints for critical structures were reviewed and compared to AAPM Task Group 101.  The prescription dose conformity was reviewed.  I approved the plan electronically.    Accordingly, Andre Pollard was brought to the TrueBeam stereotactic radiation treatment linac and placed in the custom immobilization device.  The patient was aligned according to the IR fiducial markers with BrainLab Exactrac, then orthogonal x-rays were used in ExacTrac with the 6DOF robotic table and the shifts were made to align the patient.  Then conebeam CT was performed to verify precision.  Andre Pollard received stereotactic radiosurgery uneventfully for all three lesions.  The detailed description of the procedure is recorded in the radiation oncology procedure note.  I was present for the duration of the procedure.  DISPOSITION:  Following delivery, the patient was transported to nursing in stable condition and monitored for possible acute  effects to be discharged to home in stable condition with follow-up in one month.  Charlie Pitter, MD 05/04/2014 4:44 PM

## 2014-05-04 NOTE — Addendum Note (Signed)
Encounter addended by: Deirdre Evener, RN on: 05/04/2014  7:01 PM<BR>     Documentation filed: Notes Section

## 2014-05-04 NOTE — Progress Notes (Addendum)
Andre Pollard has completed SRS to spine today.  He is c/o level 4/10/pain in his back with radiation to ribs   .  BP and pulse elevated at 165/98 with pulse of 110 immediately following his treatment.  160/80, pulse 110 at 6:39pm.   Disposition to home with his daughter at 655pm.  Instructions to daughter, per Mont Dutton, [facility] Navigator, are to call if he experiences any pain flares this weekend.  He is on a tapered Dose of Dexamethasone presently.  Daughter stated understanding of instructions.

## 2014-05-06 NOTE — Assessment & Plan Note (Addendum)
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  S/P SRS by Dr. Tammi Klippel finishing on 05/06/2014.  Presently on Nivolumab every 2 weeks beginning on 04/25/2014 with good tolerability thus far.  Pre-chemo labs as ordered.  Goals of care discussed in detail.  This will be an ongoing discussion; he is a candidate for Hospice.  Order for hospital bed at home written and faxed to [facility].  Return in 1 week for follow-up.

## 2014-05-06 NOTE — Progress Notes (Signed)
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 Nivolumab every 2 weeks beginning on 04/25/2014 with good tolerability thus far.  Pre-chemo labs as ordered.  Goals of care discussed in detail.  This will be an ongoing discussion; he is a candidate for Hospice.  Order for hospital bed at home written and faxed to [facility].  Return in 1 week for follow-up.    Brain metastasis S/P SRS by Dr. Tammi Klippel finishing on 05/06/2014.  Currently on Decadron 4 mg BID.  Will decrease to 4 mg daily over the weekend.   Back pain Increased, likely secondary to flare from radiation therapy.  Will increase Fentanyl to 37.5 mcg/hr and continue breakthrough pain management with Roxicodone.     THERAPY PLAN:  We will continue with Nivolumab every 2 weeks in a salvage approach.  We will need to taper Decadron dose as described above.  All questions were answered. The patient knows to call the clinic with any  problems, questions or concerns. We can certainly see the patient much sooner if necessary.  Patient and plan discussed with Dr. Ancil Linsey and she is in agreement with the aforementioned.   Andre Pollard 05/09/2014

## 2014-05-06 NOTE — Assessment & Plan Note (Addendum)
S/P SRS by Dr. Tammi Klippel finishing on 05/06/2014.  Currently on Decadron 4 mg BID.  Will decrease to 4 mg daily over the weekend.

## 2014-05-06 NOTE — Assessment & Plan Note (Signed)
81 year old male with stage IV melanoma, now with cerebellar metastases. He also has a history of colon cancer. He had imaging performed at the end of November 2015 showed extensive metastatic disease including liver, lungs, bone, and external iliac nodal disease. A biopsy of the left T7 transverse process was positive for melanoma.  He will be having MRI of the spine tomorrow given his extensive disease, and concerned he may need palliative radiation. Radiation oncology is also working on getting him started on radiation for his cerebellar metastases. He is currently on dexamethasone. X  I discussed NIVOLUMAB therapy. He and his family are very anxious to start treatment. I have advised them that his radiation therapy is not a contraindication to start. And we will work on moving this forward as quickly as possible.  We addressed end-of-life issues, it was very difficult for the patient to discuss them. He remains hopeful about treatment he is aware of the serious nature of his current disease. He agreed to fill out a new living will. We will discuss these issues further, his upcoming follow-up.

## 2014-05-07 ENCOUNTER — Encounter: Payer: Self-pay | Admitting: Radiation Therapy

## 2014-05-07 ENCOUNTER — Telehealth: Payer: Self-pay | Admitting: Radiation Oncology

## 2014-05-07 NOTE — Progress Notes (Unsigned)
Mel's daughter, [name], called to let us know that [name] did not have a good weekend. He was treated with SRS in a single fraction to T2, T7 and T12 Friday afternoon 05/04/14. On Sat he complained of worsening pain and discomfort. He was also having issues with voluntary movement,  "his brain was not  communicating well with the rest of his body."     [name]'s other daughter, [name], was here with him for his Spinal treatment on Friday. I shared with her that sometimes a patient will experience a pain flare after their treatment that would prompt a course of steroids. [name] is already on a steroid taper from the brain  Mets treated on 1/7. [name] is asking if we need to increase [name]'s steroid dose to help improve his symptoms. [name] did share that [name]'s pain seemed to be better on Sunday 1/17, after changing his Fentanyl patch.   I will forward this message to Dr. Johny Shears nurse, Joaquim Lai RN.

## 2014-05-07 NOTE — Telephone Encounter (Signed)
Phoned patient's daughter, [name], to discuss message received from Marcine Matar. She reports that Saturday and most of Sunday her father was really off balance and experiencing a lot of pain. [name] reports her father is doing much better today. She explains that the home health aid has been out today and given him a bath. Also, physical therapy has spent time with him today. She explains he engaged in all this activity without complaint or hesitancy. She reports his current dose of decadron is 4 mg one tablet each morning and one tablet each night. Encouraged her to wait another 24 hours to see if he improves further and if not his decadron dose may need to be increased. She verbalized understanding and expressed appreciation for the call.

## 2014-05-08 NOTE — Progress Notes (Signed)
  Radiation Oncology         [phone]) [phone] ________________________________  Name: GREGOREY NABOR MRN: [id number]  Date: 05/04/2014  DOB: 03-07-1930  End of Treatment Note  Diagnosis:   Metastatic Melanoma with brain and spinal metastases     Indication for treatment:  Palliation       Radiation treatment dates:   04/26/2014, 05/04/2014  Site/dose/beams/energy:    1.  On 04/26/14, [name] received stereotactic radiosurgery to the Right cerebellar 15 mm target was treated using 2 Dynamic Conformal Arcs to a prescription dose of 20 Gy. ExacTrac registration was performed for each couch angle. The 82.6% isodose line was prescribed. 6 MV X-rays were delivered in the flattening filter free beam mode.  2.  On 05/04/14, [name] received stereotactic radiosurgery to the following targets:  a.   The targeted metastasis in the T2 vertebral body and left pedicle was treated using 2 Rapid Arc VMAT Beams to a prescription dose of 18 Gy to the gross disease (GTV) seen on imaging, while a secondary lower prescription dose of 14 Gy was delivered to the entirety of each directly involved marrow compartment of the gross disease (CTV). The beams were delivered with 6 MV X-rays in the flattening filter free mode.  b.   The targeted metastasis in the T7 vertebral body and left pedicle was treated using 2 Rapid Arc VMAT Beams to a prescription dose of 18 Gy to the gross disease (GTV) seen on imaging, while a secondary lower prescription dose of 14 Gy was delivered to the entirety of each directly involved marrow compartment of the gross disease (CTV). The beams were delivered with 6 MV X-rays in the flattening filter free mode.  c.   The targeted metastasis in the T12 vertebral body was treated using 3 Rapid Arc VMAT Beams to a prescription dose of 18 Gy to the gross disease (GTV) seen on imaging, while a secondary lower prescription dose of 14 Gy was delivered to the entirety of each directly  involved marrow compartment of the gross disease (CTV). The beams were delivered with 6 MV X-rays in the flattening filter free mode.  Narrative: The patient tolerated radiation treatment relatively well.   He did not suffer with any acute complications related to his radiation. Overall, his performance status was very poor at the outset and did not seem to improve during his interactions in the radiation clinic.  Plan: The patient has completed radiation treatment. The patient will return to radiation oncology clinic for routine followup in one month. I advised them to call or return sooner if they have any questions or concerns related to their recovery or treatment. ________________________________  Sheral Apley. Tammi Klippel, M.D.

## 2014-05-09 ENCOUNTER — Encounter (HOSPITAL_BASED_OUTPATIENT_CLINIC_OR_DEPARTMENT_OTHER): Payer: Medicare Other | Admitting: Oncology

## 2014-05-09 ENCOUNTER — Encounter (HOSPITAL_COMMUNITY): Payer: Self-pay | Admitting: Oncology

## 2014-05-09 ENCOUNTER — Telehealth (HOSPITAL_COMMUNITY): Payer: Self-pay

## 2014-05-09 ENCOUNTER — Encounter (HOSPITAL_BASED_OUTPATIENT_CLINIC_OR_DEPARTMENT_OTHER): Payer: Medicare Other

## 2014-05-09 VITALS — BP 130/70 | HR 120 | Temp 98.0°F | Resp 16 | Wt 182.1 lb

## 2014-05-09 DIAGNOSIS — C4330 Malignant melanoma of unspecified part of face: Secondary | ICD-10-CM | POA: Diagnosis not present

## 2014-05-09 DIAGNOSIS — Z515 Encounter for palliative care: Secondary | ICD-10-CM | POA: Insufficient documentation

## 2014-05-09 DIAGNOSIS — Z66 Do not resuscitate: Secondary | ICD-10-CM | POA: Insufficient documentation

## 2014-05-09 DIAGNOSIS — Z5112 Encounter for antineoplastic immunotherapy: Secondary | ICD-10-CM

## 2014-05-09 DIAGNOSIS — C7931 Secondary malignant neoplasm of brain: Secondary | ICD-10-CM | POA: Diagnosis not present

## 2014-05-09 DIAGNOSIS — C439 Malignant melanoma of skin, unspecified: Secondary | ICD-10-CM

## 2014-05-09 DIAGNOSIS — M546 Pain in thoracic spine: Secondary | ICD-10-CM

## 2014-05-09 DIAGNOSIS — M549 Dorsalgia, unspecified: Secondary | ICD-10-CM | POA: Insufficient documentation

## 2014-05-09 DIAGNOSIS — R531 Weakness: Secondary | ICD-10-CM | POA: Insufficient documentation

## 2014-05-09 LAB — CBC WITH DIFFERENTIAL/PLATELET
Basophils Absolute: 0 10*3/uL (ref 0.0–0.1)
Basophils Relative: 0 % (ref 0–1)
EOS ABS: 0 10*3/uL (ref 0.0–0.7)
Eosinophils Relative: 0 % (ref 0–5)
HCT: 39.6 % (ref 39.0–52.0)
HEMOGLOBIN: 12.9 g/dL — AB (ref 13.0–17.0)
Lymphocytes Relative: 2 % — ABNORMAL LOW (ref 12–46)
Lymphs Abs: 0.2 10*3/uL — ABNORMAL LOW (ref 0.7–4.0)
MCH: 27.2 pg (ref 26.0–34.0)
MCHC: 32.6 g/dL (ref 30.0–36.0)
MCV: 83.4 fL (ref 78.0–100.0)
Monocytes Absolute: 0.5 10*3/uL (ref 0.1–1.0)
Monocytes Relative: 5 % (ref 3–12)
NEUTROS PCT: 93 % — AB (ref 43–77)
Neutro Abs: 8.7 10*3/uL — ABNORMAL HIGH (ref 1.7–7.7)
Platelets: 142 10*3/uL — ABNORMAL LOW (ref 150–400)
RBC: 4.75 MIL/uL (ref 4.22–5.81)
RDW: 13.6 % (ref 11.5–15.5)
WBC: 9.4 10*3/uL (ref 4.0–10.5)

## 2014-05-09 LAB — COMPREHENSIVE METABOLIC PANEL
ALK PHOS: 104 U/L (ref 39–117)
ALT: 24 U/L (ref 0–53)
ANION GAP: 7 (ref 5–15)
AST: 22 U/L (ref 0–37)
Albumin: 2.4 g/dL — ABNORMAL LOW (ref 3.5–5.2)
BUN: 24 mg/dL — AB (ref 6–23)
CALCIUM: 8.6 mg/dL (ref 8.4–10.5)
CO2: 28 mmol/L (ref 19–32)
Chloride: 99 mEq/L (ref 96–112)
Creatinine, Ser: 0.75 mg/dL (ref 0.50–1.35)
GFR, EST NON AFRICAN AMERICAN: 83 mL/min — AB (ref >90)
GLUCOSE: 173 mg/dL — AB (ref 70–99)
Potassium: 4.3 mmol/L (ref 3.5–5.1)
SODIUM: 134 mmol/L — AB (ref 135–145)
Total Bilirubin: 0.4 mg/dL (ref 0.3–1.2)
Total Protein: 5.8 g/dL — ABNORMAL LOW (ref 6.0–8.3)

## 2014-05-09 LAB — TSH: TSH: 1.22 u[IU]/mL (ref 0.350–4.500)

## 2014-05-09 MED ORDER — SODIUM CHLORIDE 0.9 % IJ SOLN: 10.0000 mL | INTRAMUSCULAR | Status: DC | PRN

## 2014-05-09 MED ORDER — FENTANYL 12 MCG/HR TD PT72: 12.5000 ug | MEDICATED_PATCH | TRANSDERMAL | Status: AC

## 2014-05-09 MED ORDER — OXYCODONE HCL 5 MG PO TABS: 5.0000 mg | ORAL_TABLET | ORAL | Status: DC | PRN

## 2014-05-09 MED ORDER — FENTANYL 12 MCG/HR TD PT72: 12.5000 ug | MEDICATED_PATCH | TRANSDERMAL | Status: DC

## 2014-05-09 MED ORDER — HEPARIN SOD (PORK) LOCK FLUSH 100 UNIT/ML IV SOLN: 500.0000 [IU] | Freq: Once | INTRAVENOUS | Status: DC | PRN

## 2014-05-09 MED ORDER — SODIUM CHLORIDE 0.9 % IV SOLN
Freq: Once | INTRAVENOUS | Status: AC
  Administered 2014-05-09: 14:00:00 via INTRAVENOUS

## 2014-05-09 MED ORDER — OXYCODONE HCL 10 MG PO TABS: 10.0000 mg | ORAL_TABLET | ORAL | Status: AC | PRN

## 2014-05-09 MED ORDER — SODIUM CHLORIDE 0.9 % IV SOLN
3.0000 mg/kg | Freq: Once | INTRAVENOUS | Status: AC
  Administered 2014-05-09: 250 mg via INTRAVENOUS
  Filled 2014-05-09: qty 25

## 2014-05-09 MED ORDER — FENTANYL 25 MCG/HR TD PT72: 25.0000 ug | MEDICATED_PATCH | TRANSDERMAL | Status: AC

## 2014-05-09 NOTE — Patient Instructions (Addendum)
[place] at [facility]  Discharge Instructions:  We will increase your Fentanyl patch to 37.5 mcg/hr every 2 days.  Add a 12.5 mcg/hr patch to a 25 mcg/hr patch to equal 37.5 mcg/hr.   We will refill our Oxycodone medication. Restart ASA 81 mg daily.  Treatment today as planned.  Continue Dexamethasone 2 tablets daily, and then decrease to 1 tablet daily.   Please call with any questions and concerns.  Update [place] on pain control.  We may need to increase Fentanyl at some time. Return in 1 week for follow-up. _______________________________________________________________  Thank you for choosing [place] at [facility] to provide your oncology and hematology care.  To afford each patient quality time with our providers, please arrive at least 15 minutes before your scheduled appointment.  You need to re-schedule your appointment if you arrive 10 or more minutes late.  We strive to give you quality time with our providers, and arriving late affects you and other patients whose appointments are after yours.  Also, if you no show three or more times for appointments you may be dismissed from the clinic.  Again, thank you for choosing [place] at [place] hope is that these requests will allow you access to exceptional care and in a timely manner. _______________________________________________________________  If you have questions after your visit, please contact our office at [phone] between the hours of 8:30 a.m. and 5:00 p.m. Voicemails left after 4:30 p.m. will not be returned until the following business day. _______________________________________________________________  For prescription refill requests, have your pharmacy contact our office. _______________________________________________________________  Recommendations made by the consultant and any test results will be sent to your referring  physician. _______________________________________________________________

## 2014-05-09 NOTE — Progress Notes (Signed)
Tolerated tx well.  Discharged via wheelchair in c/o family for transport home. Alert, in no apparent distress.

## 2014-05-09 NOTE — Telephone Encounter (Signed)
Called to let us know that [name] has some drooping of left eye and some bleeding from left nostril when seen yesterday. Stated that he also seems a little more alert.  Robynn Pane, PA- C notified.

## 2014-05-09 NOTE — Assessment & Plan Note (Signed)
Increased, likely secondary to flare from radiation therapy.  Will increase Fentanyl to 37.5 mcg/hr and continue breakthrough pain management with Roxicodone.

## 2014-05-14 ENCOUNTER — Encounter (HOSPITAL_COMMUNITY): Payer: Medicare Other | Admitting: Oncology

## 2014-05-14 ENCOUNTER — Encounter (HOSPITAL_COMMUNITY): Payer: Self-pay | Admitting: Lab

## 2014-05-14 DIAGNOSIS — C439 Malignant melanoma of skin, unspecified: Secondary | ICD-10-CM | POA: Diagnosis not present

## 2014-05-14 DIAGNOSIS — N390 Urinary tract infection, site not specified: Secondary | ICD-10-CM

## 2014-05-14 NOTE — Patient Instructions (Signed)
[place] at [facility]  Discharge Instructions:  We will refer the patient to Hospice. Website for clinical trials is: clinicaltrials.gov _______________________________________________________________  Thank you for choosing [place] at [facility] to provide your oncology and hematology care.  To afford each patient quality time with our providers, please arrive at least 15 minutes before your scheduled appointment.  You need to re-schedule your appointment if you arrive 10 or more minutes late.  We strive to give you quality time with our providers, and arriving late affects you and other patients whose appointments are after yours.  Also, if you no show three or more times for appointments you may be dismissed from the clinic.  Again, thank you for choosing [place] at [place] hope is that these requests will allow you access to exceptional care and in a timely manner. _______________________________________________________________  If you have questions after your visit, please contact our office at [phone] between the hours of 8:30 a.m. and 5:00 p.m. Voicemails left after 4:30 p.m. will not be returned until the following business day. _______________________________________________________________  For prescription refill requests, have your pharmacy contact our office. _______________________________________________________________  Recommendations made by the consultant and any test results will be sent to your referring physician. _______________________________________________________________

## 2014-05-14 NOTE — Progress Notes (Signed)
Referral made to Hospice.  Orders and records faxed on 1/25

## 2014-05-14 NOTE — Progress Notes (Signed)
Andre Pollard's two daughters came to the clinic as a walk-in requesting to speak to a provider regarding their father's decline.  They report that Andre Pollard has declined in the past few days.  He is in more pain.  He is demonstrating more neurological issues with regards to a discordance in what he is saying and what he is thinking: "I wish that bowel movement would bring me some black coffee."  Andre Pollard's one daughter who is here today has medical power of attorney.  They report that today, the physical therapist was assessing Andre Pollard and he is now a maximum assist.  The family, with Andre Pollard, had a small conference today and at the conclusion, they all agreed upon Hospice, including Andre Pollard.  With that, I will make the referral which is appropriate given his metastatic melanoma.  I will place the order.    Patient and plan discussed with Dr. Ancil Linsey and she is in agreement with the aforementioned.   Ciin Brazzel 05/14/2014

## 2014-05-15 LAB — URINE CULTURE
Colony Count: NO GROWTH
Culture: NO GROWTH

## 2014-05-16 ENCOUNTER — Ambulatory Visit (HOSPITAL_COMMUNITY): Payer: Medicare Other | Admitting: Hematology & Oncology

## 2014-05-17 ENCOUNTER — Encounter: Payer: Self-pay | Admitting: Radiation Therapy

## 2014-05-17 NOTE — Progress Notes (Unsigned)
Andre Pollard has been declining since his treatment to his spine. His pain has increased and steroids were contraindicated due to immunotherapy.  His pain medication was increased with very little help. He then had another immunotherapy  Treatment on 05/09/14. Since then he has declined even further with confusion and weakness. Per the family's request a Hospice referral was made. He is now bed bound and they are trying to make his pain manageable.    Andre Pollard R.T. (R) (T) [facility] [place] [phone] Office [phone] Pager [phone] Fax [email or web address]

## 2014-05-23 ENCOUNTER — Ambulatory Visit (HOSPITAL_COMMUNITY): Payer: Medicare Other | Admitting: Hematology & Oncology

## 2014-05-23 ENCOUNTER — Inpatient Hospital Stay (HOSPITAL_COMMUNITY): Payer: Medicare Other

## 2014-06-04 ENCOUNTER — Ambulatory Visit: Admission: RE | Admit: 2014-06-04 | Payer: Medicare Other | Source: Ambulatory Visit | Admitting: Radiation Oncology

## 2014-06-06 ENCOUNTER — Inpatient Hospital Stay (HOSPITAL_COMMUNITY): Payer: Medicare Other

## 2014-06-19 DEATH — deceased

## 2014-08-17 ENCOUNTER — Other Ambulatory Visit (HOSPITAL_COMMUNITY): Payer: Self-pay | Admitting: Oncology

## 2014-12-07 ENCOUNTER — Encounter: Payer: Self-pay | Admitting: Radiation Therapy

## 2014-12-07 NOTE — Progress Notes (Signed)
Andre Pollard was born on [birth year] and passed away on Jun 06, 2014. Andre Pollard was a resident of [place], [place] at the time of his passing.

## 2016-01-01 IMAGING — MR MR HEAD WO/W CM
7 of 14 series · 24 of 48 positions shown · IV contrast (multihance)
Comparison: Prior CT from 03/04/2014

CLINICAL DATA: Initial evaluation for weakness, ataxia.

EXAM:
MRI HEAD WITHOUT AND WITH CONTRAST
TECHNIQUE: Multiplanar, multiecho pulse sequences of the brain and surrounding
structures were obtained without and with intravenous contrast.
CONTRAST:  18mL MULTIHANCE GADOBENATE DIMEGLUMINE 529 MG/ML IV SOLN

[Series 5: T2 · axial · 5.0mm · 0.75mm/px · 1 of 23 slices shown (1 of 2)]
[im 1/23]
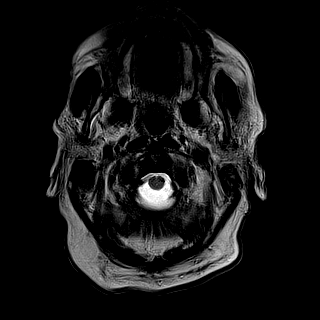

[Series 6: FLAIR · axial · 5.0mm · 0.94mm/px · z∈[-100,+42]mm · 2 of 23 slices shown]
[im 1/23]
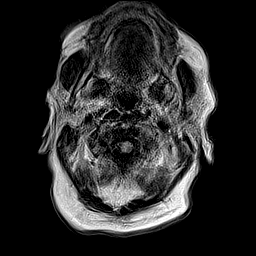
[im 23/23]
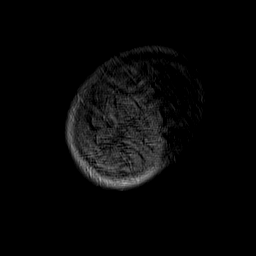

[Series 10: T1 · axial · 2.0mm · 0.45mm/px · z∈[-122,+65]mm · 7 of 95 slices shown (1 of 2)]
[im 1/95]
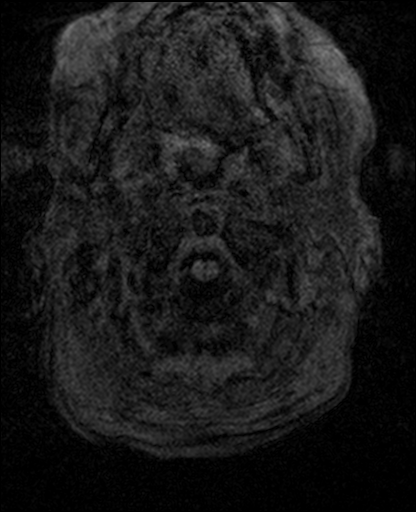
[im 16/95]
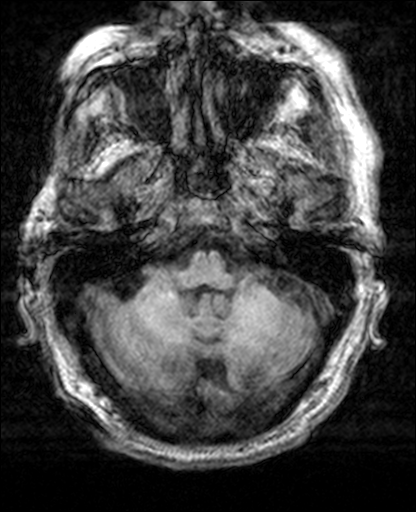
[im 32/95]
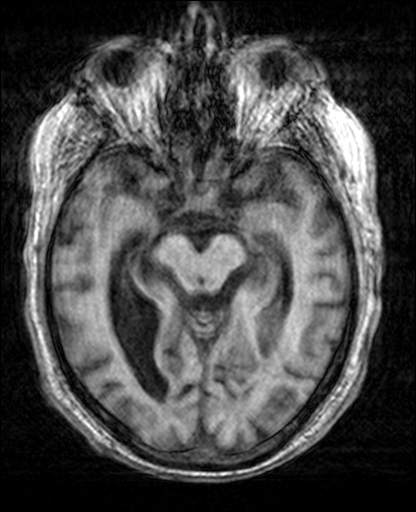
[im 48/95]
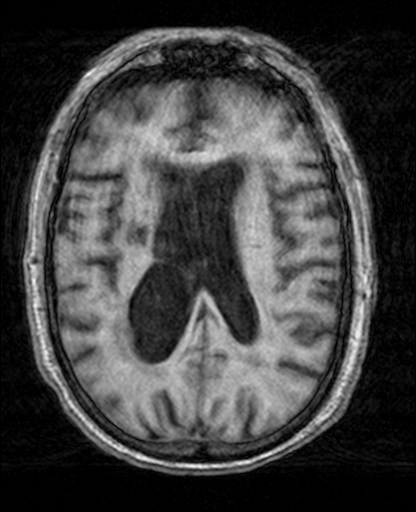
[im 63/95]
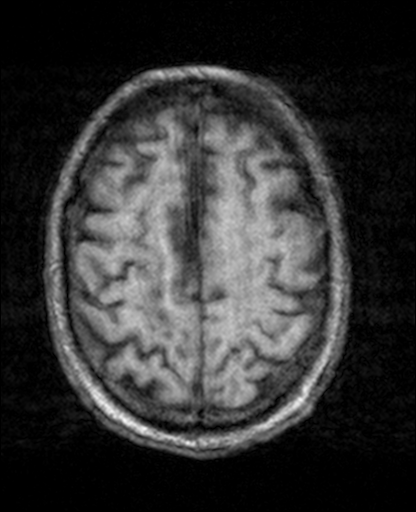
[im 79/95]
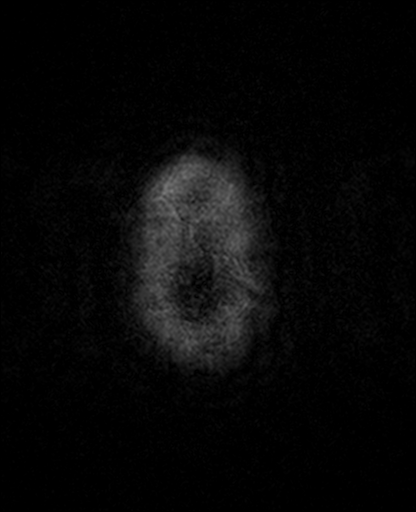
[im 95/95]
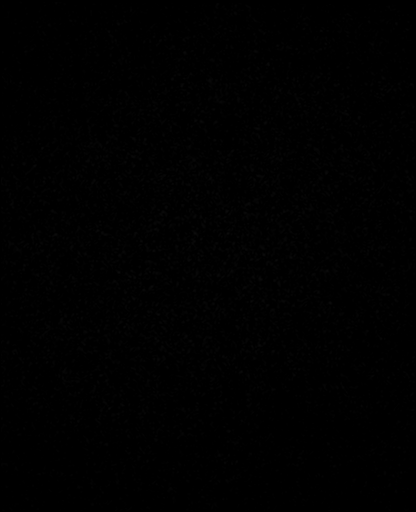

[Series 12: T1 post-contrast · coronal · 5.0mm · 0.42mm/px · 2 of 26 slices shown (1 of 2)]
[im 1/26]
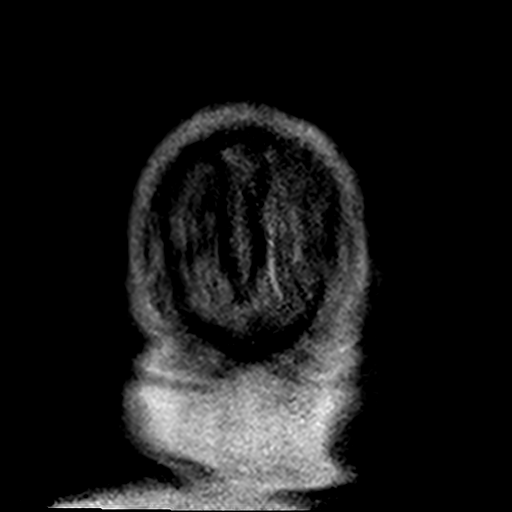
[im 26/26]
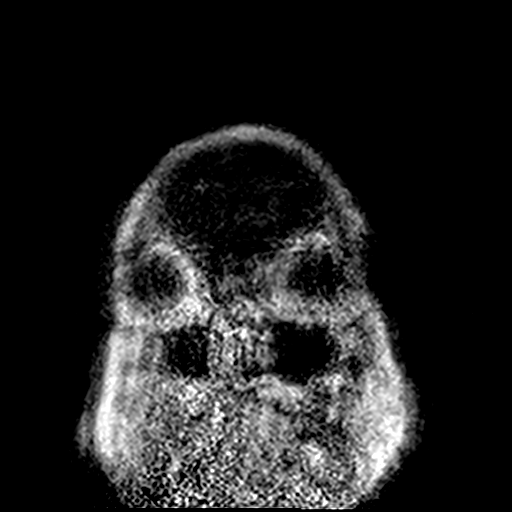

[Series 13: T2 · coronal · 5.0mm · 0.65mm/px · 2 of 28 slices shown (2 of 2)]
[im 1/28]
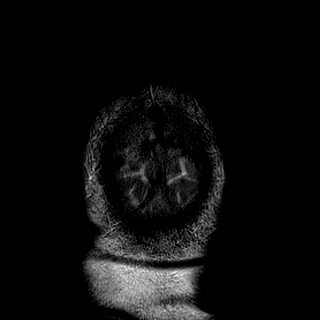
[im 28/28]
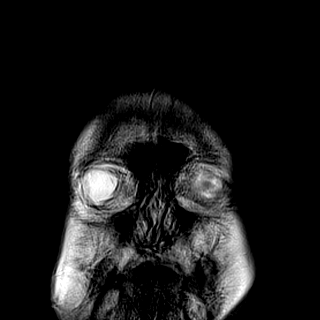

[Series 16: T1 post-contrast · axial · 2.0mm · 0.45mm/px · z∈[-116,+65]mm · 7 of 92 slices shown (2 of 2)]
[im 1/92]
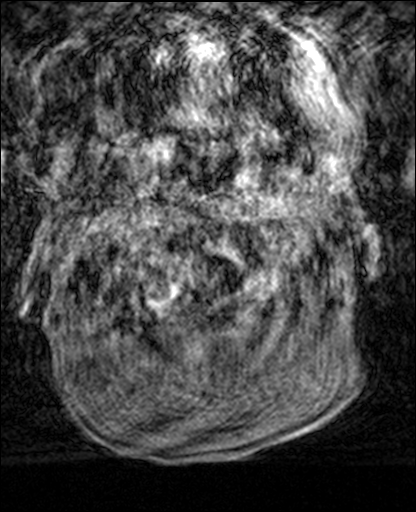
[im 16/92]
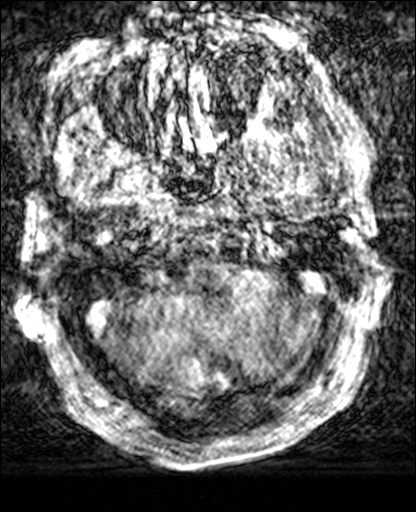
[im 31/92]
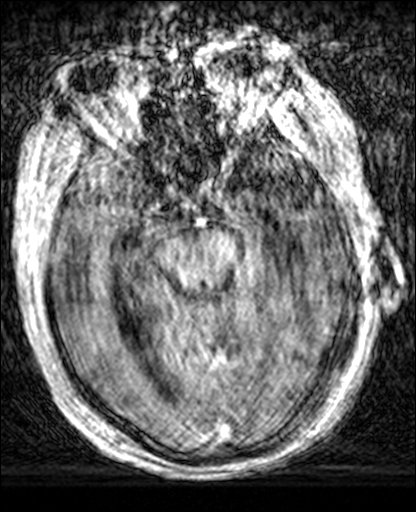
[im 46/92]
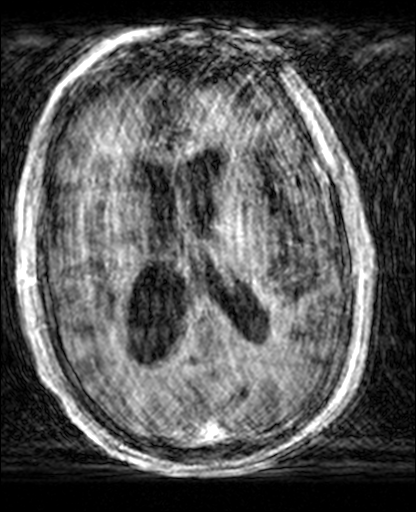
[im 61/92]
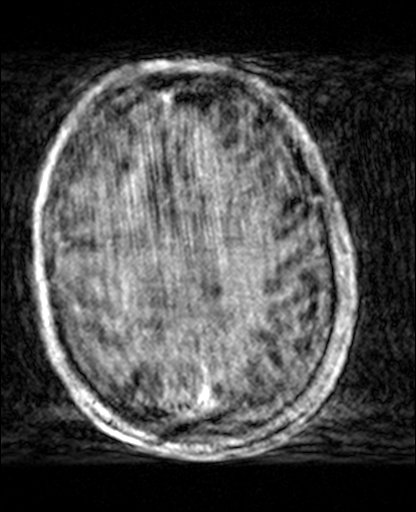
[im 76/92]
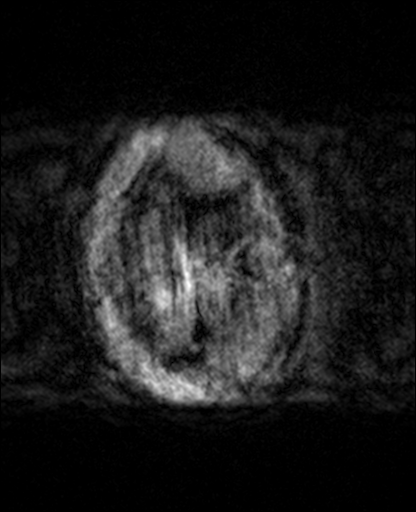
[im 92/92]
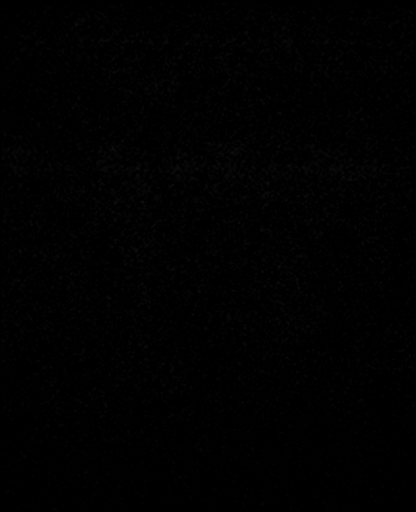

[Series 17: T1 · axial · 2.0mm · 0.45mm/px · z∈[-112,-54]mm · 3 of 90 slices shown (2 of 2)]
[im 1/90]
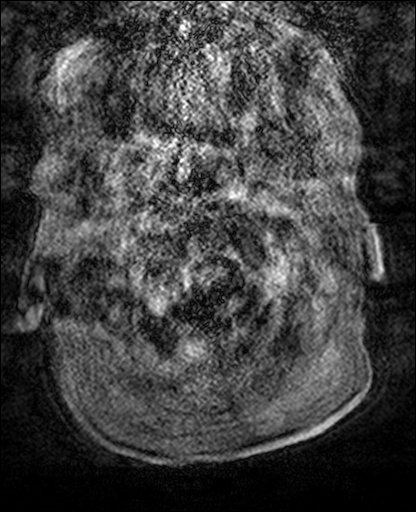
[im 15/90]
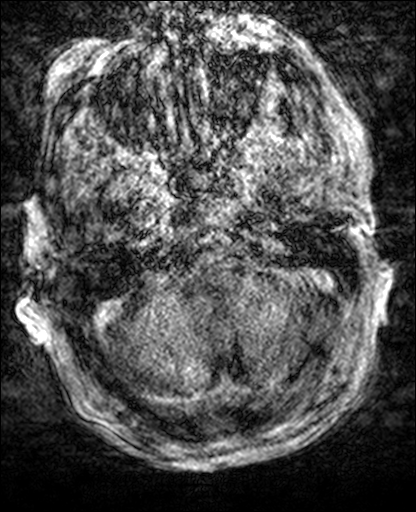
[im 30/90]
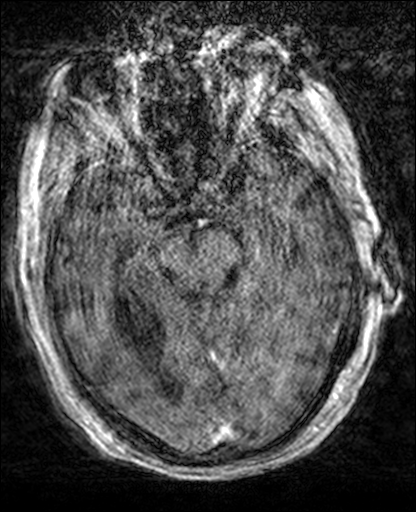

[24 of 48 positions shown; findings below may reference images not displayed]

FINDINGS: Study is degraded by motion artifact.

Diffuse prominence of the CSF containing spaces is compatible with
generalized cerebral atrophy. Patchy and confluent T2/FLAIR
hyperintensity within the periventricular and deep white matter both
cerebral hemispheres most consistent with chronic small vessel
ischemic disease. Remote lacunar infarcts present within the right
corona radiata and centrum semi ovale.

Nonspecific intraventricular cyst within the right lateral ventricle
measuring 3.8 x 2.7 cm is stable. No internal complexity or
septations. Overall ventricular size is stable without
hydrocephalus.

There is a in 12 x 19 x 17 mm focus of diffusion abnormality within
the medial aspect of the right cerebellar hemisphere (series 100,
image 8). No associated hypo intense signal is intensity seen on
corresponding ADC map. There is corresponding T2/FLAIR signal
intensity with localized vasogenic edema. There is associated
relatively solid enhancement on post-contrast sequences, although
poorly evaluated due to extensive motion artifact on post-contrast
imaging. Finding is concerning for possible intracranial mass,
suspicious for metastasis. Possible subacute infarct could be
considered as well, although this is less favored given the relative
solid enhancement of this lesion. No other definite lesions are
identified, although evaluation is severely limited due to motion
artifact.

No mass lesion or midline shift.  No extra-axial fluid collection.

Craniocervical junction within normal limits. Pituitary gland
normal.

No acute abnormality seen about the orbits.

Bone marrow signal intensity within normal limits. Scalp soft
tissues unremarkable.

Paranasal sinuses clear.  No mastoid effusion.
IMPRESSION: 1. 12 x 19 x 17 mm enhancing lesion within the medial right
cerebellar hemisphere. Finding is worrisome for possible
intracranial metastasis. While a subacute infarct could conceivably
be considered, this is less favored given the relative solid
enhancement and associated vasogenic edema. No other definite
lesions identified, however, evaluation is markedly limited due to
extensive motion artifact on this examination.
2. Grossly stable atrophy with chronic microvascular ischemic
disease and sequela of remote ischemia.
Critical Value/emergent results were called by telephone at the time
of interpretation on 04/10/2014 at [DATE] to Dr. SUNGKI KORNDORFER ,
who verbally acknowledged these results.
